# Patient Record
Sex: Male | Born: 1975 | Race: White | Hispanic: No | Marital: Single | Smoking: Former smoker
Health system: Southern US, Community
[De-identification: ages and names within clinical notes are randomized; demographics above are authoritative.]

## PROBLEM LIST (undated history)

## (undated) DIAGNOSIS — M1990 Unspecified osteoarthritis, unspecified site: Secondary | ICD-10-CM

## (undated) DIAGNOSIS — E781 Pure hyperglyceridemia: Secondary | ICD-10-CM

## (undated) DIAGNOSIS — M109 Gout, unspecified: Secondary | ICD-10-CM

## (undated) DIAGNOSIS — I10 Essential (primary) hypertension: Secondary | ICD-10-CM

## (undated) HISTORY — DX: Pure hyperglyceridemia: E78.1

## (undated) HISTORY — DX: Essential (primary) hypertension: I10

## (undated) HISTORY — DX: Unspecified osteoarthritis, unspecified site: M19.90

## (undated) HISTORY — DX: Gout, unspecified: M10.9

---

## 2005-12-12 ENCOUNTER — Emergency Department: Payer: Self-pay | Admitting: Emergency Medicine

## 2005-12-14 ENCOUNTER — Emergency Department: Payer: Self-pay | Admitting: Emergency Medicine

## 2005-12-19 ENCOUNTER — Emergency Department: Payer: Self-pay | Admitting: Emergency Medicine

## 2007-08-31 ENCOUNTER — Ambulatory Visit: Payer: Self-pay | Admitting: Internal Medicine

## 2008-04-09 ENCOUNTER — Ambulatory Visit: Payer: Self-pay | Admitting: Internal Medicine

## 2008-04-18 ENCOUNTER — Ambulatory Visit: Payer: Self-pay | Admitting: Internal Medicine

## 2008-10-10 ENCOUNTER — Emergency Department: Payer: Self-pay | Admitting: Emergency Medicine

## 2011-03-20 ENCOUNTER — Ambulatory Visit: Payer: Self-pay | Admitting: Internal Medicine

## 2012-02-06 ENCOUNTER — Emergency Department: Payer: Self-pay | Admitting: Emergency Medicine

## 2012-08-18 ENCOUNTER — Ambulatory Visit: Payer: Self-pay | Admitting: Physician Assistant

## 2013-03-09 ENCOUNTER — Ambulatory Visit: Payer: Self-pay | Admitting: Unknown Physician Specialty

## 2017-03-15 DIAGNOSIS — I10 Essential (primary) hypertension: Secondary | ICD-10-CM | POA: Diagnosis not present

## 2017-03-15 DIAGNOSIS — R0789 Other chest pain: Secondary | ICD-10-CM | POA: Diagnosis not present

## 2017-03-15 DIAGNOSIS — Z6828 Body mass index (BMI) 28.0-28.9, adult: Secondary | ICD-10-CM | POA: Diagnosis not present

## 2017-03-15 DIAGNOSIS — K219 Gastro-esophageal reflux disease without esophagitis: Secondary | ICD-10-CM | POA: Diagnosis not present

## 2017-03-15 DIAGNOSIS — R079 Chest pain, unspecified: Secondary | ICD-10-CM | POA: Diagnosis not present

## 2017-04-21 ENCOUNTER — Other Ambulatory Visit: Payer: Self-pay | Admitting: Internal Medicine

## 2017-06-03 ENCOUNTER — Other Ambulatory Visit: Payer: Self-pay | Admitting: Nurse Practitioner

## 2017-06-03 MED ORDER — AMLODIPINE BESYLATE 10 MG PO TABS: ORAL_TABLET | ORAL | 3 refills | Status: DC

## 2017-08-07 ENCOUNTER — Other Ambulatory Visit: Payer: Self-pay | Admitting: Internal Medicine

## 2017-08-12 ENCOUNTER — Telehealth: Payer: Self-pay

## 2017-08-12 ENCOUNTER — Other Ambulatory Visit: Payer: Self-pay

## 2017-08-12 MED ORDER — ATENOLOL 50 MG PO TABS: 50.0000 mg | ORAL_TABLET | Freq: Every day | ORAL | 0 refills | Status: DC

## 2017-08-12 MED ORDER — ALLOPURINOL 100 MG PO TABS: 100.0000 mg | ORAL_TABLET | Freq: Every day | ORAL | 0 refills | Status: DC

## 2017-08-12 NOTE — Telephone Encounter (Signed)
Called pt in a refill for allopurinol as well as atenolol per Leretha Pol because patient was out of both medications.

## 2017-08-12 NOTE — Telephone Encounter (Signed)
Can you please call in refill for me? Thanks

## 2017-08-20 ENCOUNTER — Ambulatory Visit: Payer: 59 | Admitting: Nurse Practitioner

## 2017-08-20 ENCOUNTER — Encounter: Payer: Self-pay | Admitting: Nurse Practitioner

## 2017-08-20 VITALS — BP 135/80 | HR 71 | Resp 16 | Ht 74.0 in | Wt 243.8 lb

## 2017-08-20 DIAGNOSIS — M1A9XX0 Chronic gout, unspecified, without tophus (tophi): Secondary | ICD-10-CM | POA: Diagnosis not present

## 2017-08-20 DIAGNOSIS — I10 Essential (primary) hypertension: Secondary | ICD-10-CM | POA: Diagnosis not present

## 2017-08-20 MED ORDER — AMLODIPINE BESYLATE 10 MG PO TABS: ORAL_TABLET | ORAL | 4 refills | Status: DC

## 2017-08-20 MED ORDER — ALLOPURINOL 100 MG PO TABS: 100.0000 mg | ORAL_TABLET | Freq: Every day | ORAL | 4 refills | Status: DC

## 2017-08-20 MED ORDER — ATENOLOL 50 MG PO TABS: 50.0000 mg | ORAL_TABLET | Freq: Every day | ORAL | 4 refills | Status: DC

## 2017-08-20 NOTE — Progress Notes (Signed)
[facility] [place], [place] [address]  Internal MEDICINE  Office Visit Note  Patient Name: Anthony Rios  [id number]  [id number]  Date of Service: 08/20/2017   Pt is here for routine follow up.   Chief Complaint  Patient presents with  . Hypertension    Hypertension  This is a chronic problem. The current episode started more than 1 year ago. The problem is unchanged. The problem is controlled. Pertinent negatives include no chest pain, headaches, neck pain, palpitations or shortness of breath. Risk factors for coronary artery disease include obesity and male gender. Past treatments include beta blockers and calcium channel blockers. The current treatment provides moderate improvement. There are no compliance problems.        Current Medication: Outpatient Encounter Medications as of 08/20/2017  Medication Sig  . allopurinol (ZYLOPRIM) 100 MG tablet Take 1 tablet (100 mg total) by mouth daily.  Marland Kitchen amLODipine (NORVASC) 10 MG tablet TAKE 1 TABLET BY MOUTH EVERYDAY AT BEDTIME  . atenolol (TENORMIN) 50 MG tablet Take 1 tablet (50 mg total) by mouth daily.  . [DISCONTINUED] allopurinol (ZYLOPRIM) 100 MG tablet Take 1 tablet (100 mg total) by mouth daily.  . [DISCONTINUED] amLODipine (NORVASC) 10 MG tablet TAKE 1 TABLET BY MOUTH EVERYDAY AT BEDTIME  . [DISCONTINUED] atenolol (TENORMIN) 50 MG tablet Take 1 tablet (50 mg total) by mouth daily.   No facility-administered encounter medications on file as of 08/20/2017.     Surgical History: History reviewed. No pertinent surgical history.  Medical History: Past Medical History:  Diagnosis Date  . Arthritis   . Gout   . Hypertension     Family History: Family History  Problem Relation Age of Onset  . Hyperlipidemia Mother     Social History   Socioeconomic History  . Marital status: Single    Spouse name: Not on file  . Number of children: Not on file  . Years of education: Not on file  .  Highest education level: Not on file  Occupational History  . Not on file  Social Needs  . Financial resource strain: Not on file  . Food insecurity:    Worry: Not on file    Inability: Not on file  . Transportation needs:    Medical: Not on file    Non-medical: Not on file  Tobacco Use  . Smoking status: Never Smoker  . Smokeless tobacco: Never Used  Substance and Sexual Activity  . Alcohol use: Yes  . Drug use: Never  . Sexual activity: Not on file  Lifestyle  . Physical activity:    Days per week: Not on file    Minutes per session: Not on file  . Stress: Not on file  Relationships  . Social connections:    Talks on phone: Not on file    Gets together: Not on file    Attends religious service: Not on file    Active member of club or organization: Not on file    Attends meetings of clubs or organizations: Not on file    Relationship status: Not on file  . Intimate partner violence:    Fear of current or ex partner: Not on file    Emotionally abused: Not on file    Physically abused: Not on file    Forced sexual activity: Not on file  Other Topics Concern  . Not on file  Social History Narrative  . Not on file      Review of  Systems  Constitutional: Negative for chills, fatigue and unexpected weight change.  HENT: Negative for congestion, postnasal drip, rhinorrhea, sneezing and sore throat.   Eyes: Negative.  Negative for redness.  Respiratory: Negative for cough, chest tightness and shortness of breath.   Cardiovascular: Negative for chest pain and palpitations.  Gastrointestinal: Negative for abdominal distention, abdominal pain, constipation, diarrhea, nausea and vomiting.  Endocrine: Negative for cold intolerance, heat intolerance, polydipsia, polyphagia and polyuria.  Genitourinary: Negative for dysuria and frequency.  Musculoskeletal: Negative for arthralgias, back pain, joint swelling and neck pain.  Skin: Negative for rash.  Allergic/Immunologic:  Negative for environmental allergies.  Neurological: Negative for dizziness, tremors, numbness and headaches.  Hematological: Negative for adenopathy. Does not bruise/bleed easily.  Psychiatric/Behavioral: Negative for behavioral problems (Depression), sleep disturbance and suicidal ideas. The patient is not nervous/anxious.     Today's Vitals   08/20/17 0901  BP: 135/80  Pulse: 71  Resp: 16  SpO2: 99%  Weight: 243 lb 12.8 oz (110.6 kg)  Height: 6' 2"  (1.88 m)    Physical Exam  Constitutional: He is oriented to person, place, and time. He appears well-developed and well-nourished. No distress.  HENT:  Head: Normocephalic and atraumatic.  Nose: Nose normal.  Mouth/Throat: Oropharynx is clear and moist. No oropharyngeal exudate.  Eyes: Pupils are equal, round, and reactive to light. Conjunctivae and EOM are normal.  Neck: Normal range of motion. Neck supple. No JVD present. No tracheal deviation present. No thyromegaly present.  Cardiovascular: Normal rate, regular rhythm and normal heart sounds. Exam reveals no gallop and no friction rub.  No murmur heard. Pulmonary/Chest: Effort normal and breath sounds normal. No respiratory distress. He has no wheezes. He has no rales. He exhibits no tenderness.  Abdominal: Soft. Bowel sounds are normal. There is no tenderness.  Musculoskeletal: Normal range of motion.  Lymphadenopathy:    He has no cervical adenopathy.  Neurological: He is alert and oriented to person, place, and time. No cranial nerve deficit.  Skin: Skin is warm and dry. Capillary refill takes less than 2 seconds. He is not diaphoretic.  Psychiatric: He has a normal mood and affect. His behavior is normal. Judgment and thought content normal.  Nursing note and vitals reviewed.  Assessment/Plan: 1. Essential hypertension Stable. Continue amlodipine and atenolol as prescribed.  - amLODipine (NORVASC) 10 MG tablet; TAKE 1 TABLET BY MOUTH EVERYDAY AT BEDTIME  Dispense: 90  tablet; Refill: 4 - atenolol (TENORMIN) 50 MG tablet; Take 1 tablet (50 mg total) by mouth daily.  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 - allopurinol (ZYLOPRIM) 100 MG tablet; Take 1 tablet (100 mg total) by mouth daily.  Dispense: 90 tablet; Refill: 4  General Counseling: Garris verbalizes understanding of the findings of todays visit and agrees with plan of treatment. I have discussed any further diagnostic evaluation that may be needed or ordered today. We also reviewed his medications today. he has been encouraged to call the office with any questions or concerns that should arise related to todays visit.    Counseling:  Hypertension Counseling:   The following hypertensive lifestyle modification were recommended and discussed:  1. Limiting alcohol intake to less than 1 oz/day of ethanol:(24 oz of beer or 8 oz of wine or 2 oz of 100-proof whiskey). 2. Take baby ASA 81 mg daily. 3. Importance of regular aerobic exercise and losing weight. 4. Reduce dietary saturated fat  and cholesterol intake for overall cardiovascular health. 5. Maintaining adequate dietary potassium, calcium, and magnesium intake. 6. Regular monitoring of the blood pressure. 7. Reduce sodium intake to less than 100 mmol/day (less than 2.3 gm of sodium or less than 6 gm of sodium choride)   This patient was seen by Leretha Pol, FNP- C in Collaboration with Dr Lavera Guise as a part of collaborative care agreement  Meds ordered this encounter  Medications  . allopurinol (ZYLOPRIM) 100 MG tablet    Sig: Take 1 tablet (100 mg total) by mouth daily.    Dispense:  90 tablet    Refill:  4    Please fill as 90 day prescription. Thanks    Order Specific Question:   Supervising Provider    Answer:   Lavera Guise [[id number]]  . amLODipine (NORVASC) 10 MG tablet    Sig: TAKE 1 TABLET BY MOUTH EVERYDAY AT  BEDTIME    Dispense:  90 tablet    Refill:  4    Please fill s 90 day prescription.    Order Specific Question:   Supervising Provider    Answer:   Lavera Guise [[id number]]  . atenolol (TENORMIN) 50 MG tablet    Sig: Take 1 tablet (50 mg total) by mouth daily.    Dispense:  90 tablet    Refill:  4    Order Specific Question:   Supervising Provider    Answer:   Lavera Guise [[id number]]    Time spent: 44 Minutes      Dr Lavera Guise Internal medicine

## 2018-01-27 ENCOUNTER — Ambulatory Visit: Payer: Self-pay | Admitting: Adult Health

## 2018-02-01 ENCOUNTER — Encounter: Payer: Self-pay | Admitting: Adult Health

## 2018-02-01 ENCOUNTER — Ambulatory Visit
Admission: RE | Admit: 2018-02-01 | Discharge: 2018-02-01 | Disposition: A | Discharge status: Home / Self Care | Payer: 59 | Source: Ambulatory Visit | Attending: Adult Health | Admitting: Adult Health

## 2018-02-01 ENCOUNTER — Ambulatory Visit: Payer: 59 | Admitting: Adult Health

## 2018-02-01 VITALS — BP 152/80 | HR 65 | Temp 99.0°F | Resp 16 | Ht 74.0 in | Wt 237.0 lb

## 2018-02-01 DIAGNOSIS — M25511 Pain in right shoulder: Secondary | ICD-10-CM | POA: Diagnosis not present

## 2018-02-01 DIAGNOSIS — M1A9XX0 Chronic gout, unspecified, without tophus (tophi): Secondary | ICD-10-CM

## 2018-02-01 DIAGNOSIS — J029 Acute pharyngitis, unspecified: Secondary | ICD-10-CM | POA: Diagnosis not present

## 2018-02-01 LAB — POCT RAPID STREP A (OFFICE): RAPID STREP A SCREEN: NEGATIVE

## 2018-02-01 MED ORDER — INDOMETHACIN 50 MG PO CAPS: 50.0000 mg | ORAL_CAPSULE | Freq: Three times a day (TID) | ORAL | 3 refills | Status: DC | PRN

## 2018-02-01 NOTE — Progress Notes (Signed)
[facility] [place], [place] [address]  Internal MEDICINE  Office Visit Note  Patient Name: Anthony Rios  [id number]  [id number]  Date of Service: 02/01/2018  Chief Complaint  Patient presents with  . Shoulder Pain    right , been going on for a month  . Sore Throat    HPI Patient here for right shoulder pain that has been present for 1 month.  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  He does not recall a specific incident that started this pain.  Denies any numbness or tingling down his arm and describes the pain as a pulling or sore feeling over his deltoid muscle.  Patient is also reporting that for 1 day now he has had a sore throat that has been very painful and he feels like it is swollen.    Current Medication: Outpatient Encounter Medications as of 02/01/2018  Medication Sig  . allopurinol (ZYLOPRIM) 100 MG tablet Take 1 tablet (100 mg total) by mouth daily.  Marland Kitchen amLODipine (NORVASC) 10 MG tablet TAKE 1 TABLET BY MOUTH EVERYDAY AT BEDTIME  . atenolol (TENORMIN) 50 MG tablet Take 1 tablet (50 mg total) by mouth daily.  . indomethacin (INDOCIN) 50 MG capsule Take 1 capsule (50 mg total) by mouth 3 (three) times daily as needed for moderate pain.   No facility-administered encounter medications on file as of 02/01/2018.     Surgical History: History reviewed. No pertinent surgical history.  Medical History: Past Medical History:  Diagnosis Date  . Arthritis   . Gout   . Hypertension     Family History: Family History  Problem Relation Age of Onset  . Hyperlipidemia Mother     Social History   Socioeconomic History  . Marital status: Single    Spouse name: Not on file  . Number of children: Not on file  . Years of education: Not on file  . Highest education level: Not on file  Occupational History  . Not on file  Social Needs  . Financial resource strain: Not  on file  . Food insecurity:    Worry: Not on file    Inability: Not on file  . Transportation needs:    Medical: Not on file    Non-medical: Not on file  Tobacco Use  . Smoking status: Never Smoker  . Smokeless tobacco: Never Used  Substance and Sexual Activity  . Alcohol use: Yes  . Drug use: Never  . Sexual activity: Not on file  Lifestyle  . Physical activity:    Days per week: Not on file    Minutes per session: Not on file  . Stress: Not on file  Relationships  . Social connections:    Talks on phone: Not on file    Gets together: Not on file    Attends religious service: Not on file    Active member of club or organization: Not on file    Attends meetings of clubs or organizations: Not on file    Relationship status: Not on file  . Intimate partner violence:    Fear of current or ex partner: Not on file    Emotionally abused: Not on file    Physically abused: Not on file    Forced sexual activity: Not on file  Other Topics Concern  . Not on file  Social History Narrative  . Not on file  Review of Systems  Constitutional: Negative.  Negative for chills, fatigue and unexpected weight change.  HENT: Positive for sore throat. Negative for congestion, rhinorrhea and sneezing.   Eyes: Negative for redness.  Respiratory: Negative.  Negative for cough, chest tightness and shortness of breath.   Cardiovascular: Negative.  Negative for chest pain and palpitations.  Gastrointestinal: Negative.  Negative for abdominal pain, constipation, diarrhea, nausea and vomiting.  Endocrine: Negative.   Genitourinary: Negative.  Negative for dysuria and frequency.  Musculoskeletal: Negative.  Negative for arthralgias, back pain, joint swelling and neck pain.       Shoulder pain  Skin: Negative.  Negative for rash.  Allergic/Immunologic: Negative.   Neurological: Negative.  Negative for tremors and numbness.  Hematological: Negative for adenopathy. Does not bruise/bleed easily.   Psychiatric/Behavioral: Negative.  Negative for behavioral problems, sleep disturbance and suicidal ideas. The patient is not nervous/anxious.     Vital Signs: BP (!) 152/80 (BP Location: Left Arm, Patient Position: Sitting, Cuff Size: Normal)   Pulse 65   Temp 99 F (37.2 C) (Oral)   Resp 16   Ht 6' 2"  (1.88 m)   Wt 237 lb (107.5 kg)   SpO2 98%   BMI 30.43 kg/m    Physical Exam  Constitutional: He is oriented to person, place, and time. He appears well-developed and well-nourished. No distress.  HENT:  Head: Normocephalic and atraumatic.  Mouth/Throat: Uvula is midline and oropharynx is clear and moist. No oropharyngeal exudate.  Eyes: Pupils are equal, round, and reactive to light. EOM are normal.  Neck: Normal range of motion. Neck supple. No JVD present. No tracheal deviation present. No thyromegaly present.  Cardiovascular: Normal rate, regular rhythm and normal heart sounds. Exam reveals no gallop and no friction rub.  No murmur heard. Pulmonary/Chest: Effort normal and breath sounds normal. No respiratory distress. He has no wheezes. He has no rales. He exhibits no tenderness.  Abdominal: Soft. There is no tenderness. There is no guarding.  Musculoskeletal: Normal range of motion.  Lymphadenopathy:    He has no cervical adenopathy.  Neurological: He is alert and oriented to person, place, and time. No cranial nerve deficit.  Skin: Skin is warm and dry. He is not diaphoretic.  Psychiatric: He has a normal mood and affect. His behavior is normal. Judgment and thought content normal.  Nursing note and vitals reviewed.  Assessment/Plan:  1. Acute pain of right shoulder X ray of right shoulder performed.  - DG Shoulder Right; Future IMPRESSION: No acute or focal abnormality. Will most likely need MRI for evaluation.   2. Sore throat Strep is negative at today's visit.  We discussed that because his symptoms only been there for just under 24 hours the test may not be  accurate.  He is instructed to call the office if symptoms persist or get worse.  Especially if he gets a fever or notices swelling of his lymph nodes.- POCT rapid strep A  3. Chronic gout without tophus, unspecified cause, unspecified site Refilled patient's indocin at his request for his chronic gout pain. - indomethacin (INDOCIN) 50 MG capsule; Take 1 capsule (50 mg total) by mouth 3 (three) times daily as needed for moderate pain.  Dispense: 90 capsule; Refill: 3  General Counseling: Anthony Rios verbalizes understanding of the findings of todays visit and agrees with plan of treatment. I have discussed any further diagnostic evaluation that may be needed or ordered today. We also reviewed his medications today. he has been encouraged to call  the office with any questions or concerns that should arise related to todays visit.    Orders Placed This Encounter  Procedures  . DG Shoulder Right  . POCT rapid strep A    Meds ordered this encounter  Medications  . indomethacin (INDOCIN) 50 MG capsule    Sig: Take 1 capsule (50 mg total) by mouth 3 (three) times daily as needed for moderate pain.    Dispense:  90 capsule    Refill:  3    Time spent: 25 Minutes   This patient was seen by Orson Gear AGNP-C in Collaboration with Dr Lavera Guise as a part of collaborative care agreement     Kendell Bane AGNP-C Internal medicine

## 2018-02-01 NOTE — Patient Instructions (Signed)
Shoulder Pain Many things can cause shoulder pain, including:  An injury.  Moving the arm in the same way again and again (overuse).  Joint pain (arthritis).  Follow these instructions at home: Take these actions to help with your pain:  Squeeze a soft ball or a foam pad as much as you can. This helps to prevent swelling. It also makes the arm stronger.  Take over-the-counter and prescription medicines only as told by your doctor.  If told, put ice on the area: ? Put ice in a plastic bag. ? Place a towel between your skin and the bag. ? Leave the ice on for 20 minutes, 2-3 times per day. Stop putting on ice if it does not help with the pain.  If you were given a shoulder sling or immobilizer: ? Wear it as told. ? Remove it to shower or bathe. ? Move your arm as little as possible. ? Keep your hand moving. This helps prevent swelling.  Contact a doctor if:  Your pain gets worse.  Medicine does not help your pain.  You have new pain in your arm, hand, or fingers. Get help right away if:  Your arm, hand, or fingers: ? Tingle. ? Are numb. ? Are swollen. ? Are painful. ? Turn white or blue. This information is not intended to replace advice given to you by your health care provider. Make sure you discuss any questions you have with your health care provider. Document Released: 07/29/2007 Document Revised: 10/06/2015 Document Reviewed: 06/04/2014 Elsevier Interactive Patient Education  [employer].

## 2018-02-03 ENCOUNTER — Telehealth: Payer: Self-pay | Admitting: Adult Health

## 2018-02-03 NOTE — Telephone Encounter (Signed)
Patient advised on xray results , adam will order an mri

## 2018-02-21 ENCOUNTER — Ambulatory Visit: Payer: Self-pay | Admitting: Adult Health

## 2018-02-24 ENCOUNTER — Telehealth: Payer: Self-pay

## 2018-02-24 NOTE — Telephone Encounter (Signed)
Unable to authorize patient mri, called and left message he will need to come by office and bring a copy of new insurance information. Beth

## 2018-07-12 ENCOUNTER — Telehealth: Payer: Self-pay

## 2018-07-12 ENCOUNTER — Other Ambulatory Visit: Payer: Self-pay

## 2018-07-12 DIAGNOSIS — M1A9XX0 Chronic gout, unspecified, without tophus (tophi): Secondary | ICD-10-CM

## 2018-07-12 MED ORDER — ALLOPURINOL 100 MG PO TABS: 100.0000 mg | ORAL_TABLET | Freq: Every day | ORAL | 0 refills | Status: DC

## 2018-07-12 NOTE — Telephone Encounter (Signed)
lmom to call us back need appt for further refills

## 2018-07-26 ENCOUNTER — Other Ambulatory Visit: Payer: Self-pay | Admitting: Nurse Practitioner

## 2018-07-26 DIAGNOSIS — M1A9XX0 Chronic gout, unspecified, without tophus (tophi): Secondary | ICD-10-CM

## 2018-07-27 ENCOUNTER — Other Ambulatory Visit: Payer: Self-pay | Admitting: Nurse Practitioner

## 2018-07-27 DIAGNOSIS — M1A9XX0 Chronic gout, unspecified, without tophus (tophi): Secondary | ICD-10-CM

## 2018-09-25 DIAGNOSIS — Z20828 Contact with and (suspected) exposure to other viral communicable diseases: Secondary | ICD-10-CM | POA: Diagnosis not present

## 2018-09-29 ENCOUNTER — Other Ambulatory Visit: Payer: Self-pay

## 2018-09-29 DIAGNOSIS — M1A9XX0 Chronic gout, unspecified, without tophus (tophi): Secondary | ICD-10-CM

## 2018-09-29 DIAGNOSIS — I10 Essential (primary) hypertension: Secondary | ICD-10-CM

## 2018-09-29 MED ORDER — ALLOPURINOL 100 MG PO TABS: 100.0000 mg | ORAL_TABLET | Freq: Every day | ORAL | 0 refills | Status: DC

## 2018-09-29 MED ORDER — ATENOLOL 50 MG PO TABS: 50.0000 mg | ORAL_TABLET | Freq: Every day | ORAL | 0 refills | Status: DC

## 2018-09-29 MED ORDER — AMLODIPINE BESYLATE 10 MG PO TABS: ORAL_TABLET | ORAL | 0 refills | Status: DC

## 2018-09-29 NOTE — Telephone Encounter (Signed)
Spoke with pt need appt for further refills he make appt next friday

## 2018-10-07 ENCOUNTER — Ambulatory Visit: Payer: Self-pay | Admitting: Nurse Practitioner

## 2018-10-24 ENCOUNTER — Other Ambulatory Visit: Payer: Self-pay

## 2018-10-24 DIAGNOSIS — I10 Essential (primary) hypertension: Secondary | ICD-10-CM

## 2018-10-24 MED ORDER — AMLODIPINE BESYLATE 10 MG PO TABS: ORAL_TABLET | ORAL | 0 refills | Status: DC

## 2018-10-24 MED ORDER — ATENOLOL 50 MG PO TABS: 50.0000 mg | ORAL_TABLET | Freq: Every day | ORAL | 0 refills | Status: DC

## 2018-11-01 ENCOUNTER — Ambulatory Visit: Payer: BC Managed Care – PPO | Admitting: Nurse Practitioner

## 2018-11-01 ENCOUNTER — Other Ambulatory Visit: Payer: Self-pay

## 2018-11-01 ENCOUNTER — Encounter: Payer: Self-pay | Admitting: Nurse Practitioner

## 2018-11-01 VITALS — BP 133/83 | HR 65 | Resp 16 | Ht 74.0 in | Wt 242.4 lb

## 2018-11-01 DIAGNOSIS — I10 Essential (primary) hypertension: Secondary | ICD-10-CM | POA: Diagnosis not present

## 2018-11-01 DIAGNOSIS — M1A9XX0 Chronic gout, unspecified, without tophus (tophi): Secondary | ICD-10-CM

## 2018-11-01 MED ORDER — ALLOPURINOL 100 MG PO TABS: 100.0000 mg | ORAL_TABLET | Freq: Every day | ORAL | 5 refills | Status: DC

## 2018-11-01 MED ORDER — AMLODIPINE BESYLATE 10 MG PO TABS: ORAL_TABLET | ORAL | 5 refills | Status: DC

## 2018-11-01 MED ORDER — INDOMETHACIN 50 MG PO CAPS: 50.0000 mg | ORAL_CAPSULE | Freq: Three times a day (TID) | ORAL | 3 refills | Status: DC | PRN

## 2018-11-01 MED ORDER — ATENOLOL 50 MG PO TABS: 50.0000 mg | ORAL_TABLET | Freq: Every day | ORAL | 5 refills | Status: DC

## 2018-11-01 NOTE — Progress Notes (Signed)
[facility] [place], [place] [address]  Internal MEDICINE  Office Visit Note  Patient Name: Anthony Rios  [id number]  [id number]  Date of Service: 11/02/2018  Chief Complaint  Patient presents with  . Hypertension  . Gout  . Arthritis  . Medication Refill    Hypertension This is a chronic problem. The current episode started more than 1 year ago. The problem is unchanged. The problem is controlled. Pertinent negatives include no chest pain, headaches, neck pain, palpitations or shortness of breath. Risk factors for coronary artery disease include obesity and male gender. Past treatments include beta blockers and calcium channel blockers. The current treatment provides moderate improvement. There are no compliance problems.        Current Medication: Outpatient Encounter Medications as of 11/01/2018  Medication Sig  . allopurinol (ZYLOPRIM) 100 MG tablet Take 1 tablet (100 mg total) by mouth daily.  Marland Kitchen amLODipine (NORVASC) 10 MG tablet TAKE 1 TABLET BY MOUTH EVERYDAY AT BEDTIME  . atenolol (TENORMIN) 50 MG tablet Take 1 tablet (50 mg total) by mouth daily.  . indomethacin (INDOCIN) 50 MG capsule Take 1 capsule (50 mg total) by mouth 3 (three) times daily as needed for moderate pain.  . [DISCONTINUED] allopurinol (ZYLOPRIM) 100 MG tablet Take 1 tablet (100 mg total) by mouth daily.  . [DISCONTINUED] amLODipine (NORVASC) 10 MG tablet TAKE 1 TABLET BY MOUTH EVERYDAY AT BEDTIME  . [DISCONTINUED] atenolol (TENORMIN) 50 MG tablet Take 1 tablet (50 mg total) by mouth daily.  . [DISCONTINUED] indomethacin (INDOCIN) 50 MG capsule Take 1 capsule (50 mg total) by mouth 3 (three) times daily as needed for moderate pain.   No facility-administered encounter medications on file as of 11/01/2018.     Surgical History: History reviewed. No pertinent surgical history.  Medical History: Past Medical History:  Diagnosis Date  . Arthritis   . Gout   . Hypertension      Family History: Family History  Problem Relation Age of Onset  . Hyperlipidemia Mother     Social History   Socioeconomic History  . Marital status: Single    Spouse name: Not on file  . Number of children: Not on file  . Years of education: Not on file  . Highest education level: Not on file  Occupational History  . Not on file  Social Needs  . Financial resource strain: Not on file  . Food insecurity    Worry: Not on file    Inability: Not on file  . Transportation needs    Medical: Not on file    Non-medical: Not on file  Tobacco Use  . Smoking status: Former Research scientist (life sciences)  . Smokeless tobacco: Former Network engineer and Sexual Activity  . Alcohol use: Yes  . Drug use: Never  . Sexual activity: Not on file  Lifestyle  . Physical activity    Days per week: Not on file    Minutes per session: Not on file  . Stress: Not on file  Relationships  . Social Herbalist on phone: Not on file    Gets together: Not on file    Attends religious service: Not on file    Active member of club or organization: Not on file    Attends meetings of clubs or organizations: Not on file    Relationship status: Not on file  . Intimate partner violence    Fear of current or ex partner: Not on file  Emotionally abused: Not on file    Physically abused: Not on file    Forced sexual activity: Not on file  Other Topics Concern  . Not on file  Social History Narrative  . Not on file      Review of Systems  Constitutional: Negative for chills, fatigue and unexpected weight change.  HENT: Negative for congestion, postnasal drip, rhinorrhea, sneezing and sore throat.   Respiratory: Negative for cough, chest tightness and shortness of breath.   Cardiovascular: Negative for chest pain and palpitations.  Gastrointestinal: Negative for abdominal distention, abdominal pain, constipation, diarrhea, nausea and vomiting.  Endocrine: Negative for cold intolerance, heat intolerance,  polydipsia and polyuria.  Musculoskeletal: Negative for arthralgias, back pain, joint swelling and neck pain.  Skin: Negative for rash.  Allergic/Immunologic: Negative for environmental allergies.  Neurological: Negative for dizziness, tremors, numbness and headaches.  Hematological: Negative for adenopathy. Does not bruise/bleed easily.  Psychiatric/Behavioral: Negative for behavioral problems (Depression), sleep disturbance and suicidal ideas. The patient is not nervous/anxious.     Today's Vitals   11/01/18 0847  BP: 133/83  Pulse: 65  Resp: 16  SpO2: 98%  Weight: 242 lb 6.4 oz (110 kg)  Height: 6' 2"  (1.88 m)   Body mass index is 31.12 kg/m.  Physical Exam Vitals signs and nursing note reviewed.  Constitutional:      General: He is not in acute distress.    Appearance: Normal appearance. He is well-developed. He is not diaphoretic.  HENT:     Head: Normocephalic and atraumatic.     Nose: Nose normal.     Mouth/Throat:     Pharynx: No oropharyngeal exudate.  Eyes:     Conjunctiva/sclera: Conjunctivae normal.     Pupils: Pupils are equal, round, and reactive to light.  Neck:     Musculoskeletal: Normal range of motion and neck supple.     Thyroid: No thyromegaly.     Vascular: No JVD.     Trachea: No tracheal deviation.  Cardiovascular:     Rate and Rhythm: Normal rate and regular rhythm.     Heart sounds: Normal heart sounds. No murmur. No friction rub. No gallop.   Pulmonary:     Effort: Pulmonary effort is normal. No respiratory distress.     Breath sounds: Normal breath sounds. No wheezing or rales.  Chest:     Chest wall: No tenderness.  Abdominal:     Palpations: Abdomen is soft.     Tenderness: There is no abdominal tenderness.  Musculoskeletal: Normal range of motion.  Lymphadenopathy:     Cervical: No cervical adenopathy.  Skin:    General: Skin is warm and dry.     Capillary Refill: Capillary refill takes less than 2 seconds.  Neurological:      Mental Status: He is alert and oriented to person, place, and time.     Cranial Nerves: No cranial nerve deficit.  Psychiatric:        Behavior: Behavior normal.        Thought Content: Thought content normal.        Judgment: Judgment normal.    Assessment/Plan:  1. Essential hypertension Blood pressure well managed. Continue BP medication as prescribed. Refills provided today.  - amLODipine (NORVASC) 10 MG tablet; TAKE 1 TABLET BY MOUTH EVERYDAY AT BEDTIME  Dispense: 30 tablet; Refill: 5 - atenolol (TENORMIN) 50 MG tablet; Take 1 tablet (50 mg total) by mouth daily.  Dispense: 45 tablet; Refill: 5  2. Chronic gout without  tophus, unspecified cause, unspecified site Continue to take allopurinol 141m daily to prevent gout flares. May take indomethacin as needed and as prescribed, - allopurinol (ZYLOPRIM) 100 MG tablet; Take 1 tablet (100 mg total) by mouth daily.  Dispense: 30 tablet; Refill: 5 - indomethacin (INDOCIN) 50 MG capsule; Take 1 capsule (50 mg total) by mouth 3 (three) times daily as needed for moderate pain.  Dispense: 90 capsule; Refill: 3  General Counseling: Amelia verbalizes understanding of the findings of todays visit and agrees with plan of treatment. I have discussed any further diagnostic evaluation that may be needed or ordered today. We also reviewed his medications today. he has been encouraged to call the office with any questions or concerns that should arise related to todays visit.  Hypertension Counseling:   The following hypertensive lifestyle modification were recommended and discussed:  1. Limiting alcohol intake to less than 1 oz/day of ethanol:(24 oz of beer or 8 oz of wine or 2 oz of 100-proof whiskey). 2. Take baby ASA 81 mg daily. 3. Importance of regular aerobic exercise and losing weight. 4. Reduce dietary saturated fat and cholesterol intake for overall cardiovascular health. 5. Maintaining adequate dietary potassium, calcium, and magnesium  intake. 6. Regular monitoring of the blood pressure. 7. Reduce sodium intake to less than 100 mmol/day (less than 2.3 gm of sodium or less than 6 gm of sodium choride)   This patient was [place] [facility] [place] F[place]vera Guiseas a part of collaborative care agreement  Meds ordered this encounter  Medications  . allopurinol (ZYLOPRIM) 100 MG tablet    Sig: Take 1 tablet (100 mg total) by mouth daily.    Dispense:  30 tablet    Refill:  5    Order Specific Question:   Supervising Provider    Answer:   KLavera Guise[[[id number]] . amLODipine (NORVASC) 10 MG tablet    Sig: TAKE 1 TABLET BY MOUTH EVERYDAY AT BEDTIME    Dispense:  30 tablet    Refill:  5    Order Specific Question:   Supervising Provider    Answer:   KLavera Guise[[[id number]] . atenolol (TENORMIN) 50 MG tablet    Sig: Take 1 tablet (50 mg total) by mouth daily.    Dispense:  45 tablet    Refill:  5    Order Specific Question:   Supervising Provider    Answer:   KLavera Guise[[[id number]] . indomethacin (INDOCIN) 50 MG capsule    Sig: Take 1 capsule (50 mg total) by mouth 3 (three) times daily as needed for moderate pain.    Dispense:  90 capsule    Refill:  3    Order Specific Question:   Supervising Provider    Answer:   KLavera Guise[[[id number]]   Time spent: 153Minutes      Dr FLavera GuiseInternal medicine

## 2019-02-09 DIAGNOSIS — Z20828 Contact with and (suspected) exposure to other viral communicable diseases: Secondary | ICD-10-CM | POA: Diagnosis not present

## 2019-04-25 ENCOUNTER — Telehealth: Payer: Self-pay

## 2019-04-25 NOTE — Telephone Encounter (Signed)
LMOM FOR PATIENT TO CONFIRM AND SCREEN FOR 04-27-19 OV.

## 2019-04-27 ENCOUNTER — Encounter: Payer: BC Managed Care – PPO | Admitting: Nurse Practitioner

## 2019-05-04 ENCOUNTER — Other Ambulatory Visit: Payer: Self-pay

## 2019-05-04 ENCOUNTER — Telehealth: Payer: Self-pay

## 2019-05-04 DIAGNOSIS — M1A9XX0 Chronic gout, unspecified, without tophus (tophi): Secondary | ICD-10-CM

## 2019-05-04 MED ORDER — ALLOPURINOL 100 MG PO TABS: 100.0000 mg | ORAL_TABLET | Freq: Every day | ORAL | 0 refills | Status: DC

## 2019-05-04 NOTE — Telephone Encounter (Signed)
Left a message and advised pt he needs appointment for further refill on medications. Anthony Rios

## 2019-05-30 ENCOUNTER — Other Ambulatory Visit: Payer: Self-pay | Admitting: Nurse Practitioner

## 2019-05-30 DIAGNOSIS — M1A9XX0 Chronic gout, unspecified, without tophus (tophi): Secondary | ICD-10-CM

## 2019-06-06 ENCOUNTER — Other Ambulatory Visit: Payer: Self-pay

## 2019-06-06 DIAGNOSIS — I10 Essential (primary) hypertension: Secondary | ICD-10-CM

## 2019-06-06 MED ORDER — AMLODIPINE BESYLATE 10 MG PO TABS: ORAL_TABLET | ORAL | 0 refills | Status: DC

## 2019-07-05 ENCOUNTER — Other Ambulatory Visit: Payer: Self-pay

## 2019-07-05 DIAGNOSIS — I10 Essential (primary) hypertension: Secondary | ICD-10-CM

## 2019-07-05 DIAGNOSIS — M1A9XX0 Chronic gout, unspecified, without tophus (tophi): Secondary | ICD-10-CM

## 2019-07-05 MED ORDER — ALLOPURINOL 100 MG PO TABS: 100.0000 mg | ORAL_TABLET | Freq: Every day | ORAL | 0 refills | Status: DC

## 2019-07-05 MED ORDER — AMLODIPINE BESYLATE 10 MG PO TABS: ORAL_TABLET | ORAL | 0 refills | Status: DC

## 2019-07-12 ENCOUNTER — Telehealth: Payer: Self-pay

## 2019-07-12 ENCOUNTER — Other Ambulatory Visit: Payer: Self-pay

## 2019-07-12 DIAGNOSIS — M1A9XX0 Chronic gout, unspecified, without tophus (tophi): Secondary | ICD-10-CM

## 2019-07-12 DIAGNOSIS — I10 Essential (primary) hypertension: Secondary | ICD-10-CM

## 2019-07-12 MED ORDER — ATENOLOL 50 MG PO TABS: 50.0000 mg | ORAL_TABLET | Freq: Every day | ORAL | 0 refills | Status: DC

## 2019-07-12 MED ORDER — AMLODIPINE BESYLATE 10 MG PO TABS: ORAL_TABLET | ORAL | 0 refills | Status: DC

## 2019-07-12 MED ORDER — ALLOPURINOL 100 MG PO TABS: 100.0000 mg | ORAL_TABLET | Freq: Every day | ORAL | 0 refills | Status: DC

## 2019-07-12 NOTE — Telephone Encounter (Signed)
Pt made her appt on June 15 send 30 days med

## 2019-07-12 NOTE — Telephone Encounter (Signed)
Called lmom informing patient need to reschedule missed 6 month fu, ann physical. klh

## 2019-08-03 ENCOUNTER — Other Ambulatory Visit: Payer: Self-pay

## 2019-08-03 DIAGNOSIS — M1A9XX0 Chronic gout, unspecified, without tophus (tophi): Secondary | ICD-10-CM

## 2019-08-03 DIAGNOSIS — I10 Essential (primary) hypertension: Secondary | ICD-10-CM

## 2019-08-03 MED ORDER — ALLOPURINOL 100 MG PO TABS: 100.0000 mg | ORAL_TABLET | Freq: Every day | ORAL | 0 refills | Status: DC

## 2019-08-03 MED ORDER — AMLODIPINE BESYLATE 10 MG PO TABS: ORAL_TABLET | ORAL | 0 refills | Status: DC

## 2019-08-04 ENCOUNTER — Telehealth: Payer: Self-pay

## 2019-08-04 NOTE — Telephone Encounter (Signed)
Lmom to confirm and screen for 08-08-19 ov.

## 2019-08-07 ENCOUNTER — Other Ambulatory Visit: Payer: Self-pay

## 2019-08-07 DIAGNOSIS — I10 Essential (primary) hypertension: Secondary | ICD-10-CM

## 2019-08-07 MED ORDER — AMLODIPINE BESYLATE 10 MG PO TABS: ORAL_TABLET | ORAL | 0 refills | Status: DC

## 2019-08-07 MED ORDER — ATENOLOL 50 MG PO TABS: 50.0000 mg | ORAL_TABLET | Freq: Every day | ORAL | 0 refills | Status: DC

## 2019-08-08 ENCOUNTER — Encounter: Payer: BC Managed Care – PPO | Admitting: Nurse Practitioner

## 2019-08-21 ENCOUNTER — Other Ambulatory Visit: Payer: Self-pay | Admitting: Internal Medicine

## 2019-08-21 DIAGNOSIS — I10 Essential (primary) hypertension: Secondary | ICD-10-CM | POA: Diagnosis not present

## 2019-08-21 DIAGNOSIS — Z0001 Encounter for general adult medical examination with abnormal findings: Secondary | ICD-10-CM | POA: Diagnosis not present

## 2019-08-21 DIAGNOSIS — M1A9XX0 Chronic gout, unspecified, without tophus (tophi): Secondary | ICD-10-CM | POA: Diagnosis not present

## 2019-08-21 DIAGNOSIS — Z125 Encounter for screening for malignant neoplasm of prostate: Secondary | ICD-10-CM | POA: Diagnosis not present

## 2019-08-21 DIAGNOSIS — R946 Abnormal results of thyroid function studies: Secondary | ICD-10-CM | POA: Diagnosis not present

## 2019-08-22 LAB — CBC
Hematocrit: 43.9 % (ref 37.5–51.0)
Hemoglobin: 14.4 g/dL (ref 13.0–17.7)
MCH: 28.5 pg (ref 26.6–33.0)
MCHC: 32.8 g/dL (ref 31.5–35.7)
MCV: 87 fL (ref 79–97)
Platelets: 180 10*3/uL (ref 150–450)
RBC: 5.06 x10E6/uL (ref 4.14–5.80)
RDW: 12.1 % (ref 11.6–15.4)
WBC: 6.3 10*3/uL (ref 3.4–10.8)

## 2019-08-22 LAB — TSH: TSH: 2.32 u[IU]/mL (ref 0.450–4.500)

## 2019-08-22 LAB — LIPID PANEL W/O CHOL/HDL RATIO
Cholesterol, Total: 207 mg/dL — ABNORMAL HIGH (ref 100–199)
HDL: 31 mg/dL — ABNORMAL LOW (ref >39)
LDL Chol Calc (NIH): 85 mg/dL (ref 0–99)
Triglycerides: 559 mg/dL (ref 0–149)
VLDL Cholesterol Cal: 91 mg/dL — ABNORMAL HIGH (ref 5–40)

## 2019-08-22 LAB — COMPREHENSIVE METABOLIC PANEL
ALT: 39 IU/L (ref 0–44)
AST: 31 IU/L (ref 0–40)
Albumin/Globulin Ratio: 1.7 (ref 1.2–2.2)
Albumin: 4.5 g/dL (ref 4.0–5.0)
Alkaline Phosphatase: 89 IU/L (ref 48–121)
BUN/Creatinine Ratio: 22 — ABNORMAL HIGH (ref 9–20)
BUN: 25 mg/dL — ABNORMAL HIGH (ref 6–24)
Bilirubin Total: 0.4 mg/dL (ref 0.0–1.2)
CO2: 21 mmol/L (ref 20–29)
Calcium: 9.1 mg/dL (ref 8.7–10.2)
Chloride: 101 mmol/L (ref 96–106)
Creatinine, Ser: 1.14 mg/dL (ref 0.76–1.27)
GFR calc Af Amer: 89 mL/min/{1.73_m2} (ref >59)
GFR calc non Af Amer: 77 mL/min/{1.73_m2} (ref >59)
Globulin, Total: 2.6 g/dL (ref 1.5–4.5)
Glucose: 96 mg/dL (ref 65–99)
Potassium: 4.8 mmol/L (ref 3.5–5.2)
Sodium: 136 mmol/L (ref 134–144)
Total Protein: 7.1 g/dL (ref 6.0–8.5)

## 2019-08-22 LAB — PSA: Prostate Specific Ag, Serum: 0.4 ng/mL (ref 0.0–4.0)

## 2019-08-22 LAB — URIC ACID: Uric Acid: 9.2 mg/dL — ABNORMAL HIGH (ref 3.8–8.4)

## 2019-08-22 LAB — T4, FREE: Free T4: 0.96 ng/dL (ref 0.82–1.77)

## 2019-08-25 ENCOUNTER — Telehealth: Payer: Self-pay

## 2019-08-25 NOTE — Telephone Encounter (Signed)
Lmom to confirm and screen for 08-29-19 ov.

## 2019-08-27 ENCOUNTER — Other Ambulatory Visit: Payer: Self-pay | Admitting: Nurse Practitioner

## 2019-08-27 DIAGNOSIS — M1A9XX0 Chronic gout, unspecified, without tophus (tophi): Secondary | ICD-10-CM

## 2019-08-27 NOTE — Progress Notes (Signed)
Is it fasting lab draw, TG are extremely elevated

## 2019-08-29 ENCOUNTER — Other Ambulatory Visit: Payer: Self-pay | Admitting: Nurse Practitioner

## 2019-08-29 ENCOUNTER — Other Ambulatory Visit: Payer: Self-pay

## 2019-08-29 ENCOUNTER — Ambulatory Visit (INDEPENDENT_AMBULATORY_CARE_PROVIDER_SITE_OTHER): Payer: BC Managed Care – PPO | Admitting: Nurse Practitioner

## 2019-08-29 ENCOUNTER — Encounter: Payer: Self-pay | Admitting: Nurse Practitioner

## 2019-08-29 VITALS — BP 144/86 | HR 61 | Temp 97.6°F | Resp 16 | Ht 74.0 in | Wt 235.2 lb

## 2019-08-29 DIAGNOSIS — I10 Essential (primary) hypertension: Secondary | ICD-10-CM

## 2019-08-29 DIAGNOSIS — Z0001 Encounter for general adult medical examination with abnormal findings: Secondary | ICD-10-CM | POA: Diagnosis not present

## 2019-08-29 DIAGNOSIS — E781 Pure hyperglyceridemia: Secondary | ICD-10-CM | POA: Diagnosis not present

## 2019-08-29 DIAGNOSIS — F1090 Alcohol use, unspecified, uncomplicated: Secondary | ICD-10-CM

## 2019-08-29 DIAGNOSIS — R300 Dysuria: Secondary | ICD-10-CM | POA: Diagnosis not present

## 2019-08-29 DIAGNOSIS — M1A9XX0 Chronic gout, unspecified, without tophus (tophi): Secondary | ICD-10-CM | POA: Diagnosis not present

## 2019-08-29 DIAGNOSIS — Z7289 Other problems related to lifestyle: Secondary | ICD-10-CM

## 2019-08-29 MED ORDER — AMLODIPINE BESYLATE 10 MG PO TABS: ORAL_TABLET | ORAL | 1 refills | Status: DC

## 2019-08-29 MED ORDER — FENOFIBRATE 145 MG PO TABS: 145.0000 mg | ORAL_TABLET | Freq: Every day | ORAL | 1 refills | Status: DC

## 2019-08-29 MED ORDER — ATENOLOL 50 MG PO TABS: 50.0000 mg | ORAL_TABLET | Freq: Every day | ORAL | 1 refills | Status: DC

## 2019-08-29 MED ORDER — ALLOPURINOL 100 MG PO TABS: 100.0000 mg | ORAL_TABLET | Freq: Every day | ORAL | 1 refills | Status: DC

## 2019-08-29 NOTE — Progress Notes (Signed)
[facility], [place] [address]  Internal MEDICINE  Office Visit Note  Patient Name: Anthony Rios  [id number]  [id number]  Date of Service: 09/03/2019   Pt is here for routine health maintenance examination  Chief Complaint  Patient presents with   Annual Exam   Arthritis   Hypertension   Gout     The patient is here for health maintenance exam. He had routine, fasting labs done prior to this visit. His triglyceride levels were very high at 553. His uric acid is also a bit elevated at 9.2. he generally takes a preventive dose of allopurinol to keep uric acid low, but he has been out of this for a few weeks. He reports regular alcohol consumption. States that his intake is usually about 6 beers per night, every night. He has no other concerns or complaints today.     Current Medication: Outpatient Encounter Medications as of 08/29/2019  Medication Sig   allopurinol (ZYLOPRIM) 100 MG tablet Take 1 tablet (100 mg total) by mouth daily.   amLODipine (NORVASC) 10 MG tablet TAKE 1 TABLET BY MOUTH EVERYDAY AT BEDTIME   atenolol (TENORMIN) 50 MG tablet Take 1 tablet (50 mg total) by mouth daily.   indomethacin (INDOCIN) 50 MG capsule Take 1 capsule (50 mg total) by mouth 3 (three) times daily as needed for moderate pain.   [DISCONTINUED] allopurinol (ZYLOPRIM) 100 MG tablet Take 1 tablet (100 mg total) by mouth daily.   [DISCONTINUED] amLODipine (NORVASC) 10 MG tablet TAKE 1 TABLET BY MOUTH EVERYDAY AT BEDTIME   [DISCONTINUED] atenolol (TENORMIN) 50 MG tablet Take 1 tablet (50 mg total) by mouth daily.   [DISCONTINUED] fenofibrate (TRICOR) 145 MG tablet Take 1 tablet (145 mg total) by mouth daily.   No facility-administered encounter medications on file as of 08/29/2019.    Surgical History: History reviewed. No pertinent surgical history.  Medical History: Past Medical History:  Diagnosis Date   Arthritis    Gout     Hypertension     Family History: Family History  Problem Relation Age of Onset   Hyperlipidemia Mother       Review of Systems  Constitutional: Negative for chills, fatigue and unexpected weight change.  HENT: Negative for congestion, postnasal drip, rhinorrhea, sneezing and sore throat.   Respiratory: Negative for cough, chest tightness and shortness of breath.   Cardiovascular: Negative for chest pain and palpitations.  Gastrointestinal: Negative for abdominal distention, abdominal pain, constipation, diarrhea, nausea and vomiting.  Endocrine: Negative for cold intolerance, heat intolerance, polydipsia and polyuria.  Genitourinary: Negative for dysuria, frequency, hematuria and urgency.  Musculoskeletal: Positive for arthralgias. Negative for back pain, joint swelling and neck pain.       States that he has not had a gout flare in over six months.   Skin: Negative for rash.  Allergic/Immunologic: Negative for environmental allergies.  Neurological: Negative for dizziness, tremors, numbness and headaches.  Hematological: Negative for adenopathy. Does not bruise/bleed easily.  Psychiatric/Behavioral: Negative for behavioral problems (Depression), sleep disturbance and suicidal ideas. The patient is not nervous/anxious.     Today's Vitals   08/29/19 0832  BP: (!) 144/86  Pulse: 61  Resp: 16  Temp: 97.6 F (36.4 C)  SpO2: 100%  Weight: 235 lb 3.2 oz (106.7 kg)  Height: 6' 2"  (1.88 m)   Body mass index is 30.2 kg/m.  Physical Exam Vitals and nursing note reviewed.  Constitutional:      General: He is not  in acute distress.    Appearance: Normal appearance. He is well-developed. He is not diaphoretic.  HENT:     Head: Normocephalic and atraumatic.     Nose: Nose normal.     Mouth/Throat:     Pharynx: No oropharyngeal exudate.  Eyes:     Conjunctiva/sclera: Conjunctivae normal.     Pupils: Pupils are equal, round, and reactive to light.  Neck:     Thyroid: No  thyromegaly.     Vascular: No JVD.     Trachea: No tracheal deviation.  Cardiovascular:     Rate and Rhythm: Normal rate and regular rhythm.     Pulses: Normal pulses.     Heart sounds: Normal heart sounds. No murmur heard.  No friction rub. No gallop.   Pulmonary:     Effort: Pulmonary effort is normal. No respiratory distress.     Breath sounds: Normal breath sounds. No wheezing or rales.  Chest:     Chest wall: No tenderness.  Abdominal:     General: Bowel sounds are normal.     Palpations: Abdomen is soft.     Tenderness: There is no abdominal tenderness.  Musculoskeletal:        General: Normal range of motion.     Cervical back: Normal range of motion and neck supple.  Lymphadenopathy:     Cervical: No cervical adenopathy.  Skin:    General: Skin is warm and dry.     Capillary Refill: Capillary refill takes less than 2 seconds.  Neurological:     Mental Status: He is alert and oriented to person, place, and time.     Cranial Nerves: No cranial nerve deficit.  Psychiatric:        Mood and Affect: Mood normal.        Behavior: Behavior normal.        Thought Content: Thought content normal.        Judgment: Judgment normal.      LABS: Recent Results (from the past 2160 hour(s))  Comprehensive metabolic panel     Status: Abnormal   Collection Time: 08/21/19  8:50 AM  Result Value Ref Range   Glucose 96 65 - 99 mg/dL   BUN 25 (H) 6 - 24 mg/dL   Creatinine, Ser 1.14 0.76 - 1.27 mg/dL   GFR calc non Af Amer 77 >59 mL/min/1.73   GFR calc Af Amer 89 >59 mL/min/1.73    Comment: **[facility] currently reports eGFR in compliance with the current**   recommendations of the [employer]. [facility] will   update reporting as new guidelines are published from the NKF-ASN   Task force.    BUN/Creatinine Ratio 22 (H) 9 - 20   Sodium 136 134 - 144 mmol/L   Potassium 4.8 3.5 - 5.2 mmol/L   Chloride 101 96 - 106 mmol/L   CO2 21 20 - 29 mmol/L   Calcium 9.1 8.7 -  10.2 mg/dL   Total Protein 7.1 6.0 - 8.5 g/dL   Albumin 4.5 4.0 - 5.0 g/dL   Globulin, Total 2.6 1.5 - 4.5 g/dL   Albumin/Globulin Ratio 1.7 1.2 - 2.2   Bilirubin Total 0.4 0.0 - 1.2 mg/dL   Alkaline Phosphatase 89 48 - 121 IU/L   AST 31 0 - 40 IU/L   ALT 39 0 - 44 IU/L  CBC     Status: None   Collection Time: 08/21/19  8:50 AM  Result Value Ref Range   WBC 6.3 3.4 -  10.8 x10E3/uL   RBC 5.06 4.14 - 5.80 x10E6/uL   Hemoglobin 14.4 13.0 - 17.7 g/dL   Hematocrit 43.9 37.5 - 51.0 %   MCV 87 79 - 97 fL   MCH 28.5 26.6 - 33.0 pg   MCHC 32.8 31 - 35 g/dL   RDW 12.1 11.6 - 15.4 %   Platelets 180 150 - 450 x10E3/uL  Lipid Panel w/o Chol/HDL Ratio     Status: Abnormal   Collection Time: 08/21/19  8:50 AM  Result Value Ref Range   Cholesterol, Total 207 (H) 100 - 199 mg/dL   Triglycerides 559 (HH) 0 - 149 mg/dL   HDL 31 (L) >39 mg/dL   VLDL Cholesterol Cal 91 (H) 5 - 40 mg/dL   LDL Chol Calc (NIH) 85 0 - 99 mg/dL  T4, free     Status: None   Collection Time: 08/21/19  8:50 AM  Result Value Ref Range   Free T4 0.96 0.82 - 1.77 ng/dL  TSH     Status: None   Collection Time: 08/21/19  8:50 AM  Result Value Ref Range   TSH 2.320 0.450 - 4.500 uIU/mL  PSA     Status: None   Collection Time: 08/21/19  8:50 AM  Result Value Ref Range   Prostate Specific Ag, Serum 0.4 0.0 - 4.0 ng/mL    Comment: Roche ECLIA methodology. According to the American Urological Association, Serum PSA should decrease and remain at undetectable levels after radical prostatectomy. The AUA defines biochemical recurrence as an initial PSA value 0.2 ng/mL or greater followed by a subsequent confirmatory PSA value 0.2 ng/mL or greater. Values obtained with different assay methods or kits cannot be used interchangeably. Results cannot be interpreted as absolute evidence of the presence or absence of malignant disease.   Uric acid     Status: Abnormal   Collection Time: 08/21/19  8:50 AM  Result Value Ref Range    Uric Acid 9.2 (H) 3.8 - 8.4 mg/dL    Comment:            Therapeutic target for gout patients: <6.0  Urinalysis, Routine w reflex microscopic     Status: None   Collection Time: 08/29/19  8:32 AM  Result Value Ref Range   Specific Gravity, UA 1.022 1.005 - 1.030   pH, UA 5.5 5.0 - 7.5   Color, UA Yellow Yellow   Appearance Ur Clear Clear   Leukocytes,UA Negative Negative   Protein,UA Negative Negative/Trace   Glucose, UA Negative Negative   Ketones, UA Negative Negative   RBC, UA Negative Negative   Bilirubin, UA Negative Negative   Urobilinogen, Ur 0.2 0.2 - 1.0 mg/dL   Nitrite, UA Negative Negative   Microscopic Examination Comment     Comment: Microscopic not indicated and not performed.   Assessment/Plan: 1. Encounter for general adult medical examination with abnormal findings Annual health maintenance exam today.  2. Hypertriglyceridemia Severe elevation of triglycerides with fasting lipid panel. Start fenofibrate 16m daily with supper. Will recheck lipid panel prior to next visit and adjust medication as indicated.  - fenofibrate (TRICOR) 145 MG tablet; Take 1 tablet (145 mg total) by mouth daily.  Dispense: 90 tablet; Refill: 1  3. Essential hypertension Stable. Continue amlodipine and atenolol as prescribed. Refills provided today.  - amLODipine (NORVASC) 10 MG tablet; TAKE 1 TABLET BY MOUTH EVERYDAY AT BEDTIME  Dispense: 90 tablet; Refill: 1 - atenolol (TENORMIN) 50 MG tablet; Take 1 tablet (50 mg total) by  mouth daily.  Dispense: 90 tablet; Refill: 1  4. Chronic gout without tophus, unspecified cause, unspecified site Continue allopurinol 174m daily to prevent gout flares.  - allopurinol (ZYLOPRIM) 100 MG tablet; Take 1 tablet (100 mg total) by mouth daily.  Dispense: 90 tablet; Refill: 1  5. Chronic alcohol use Discussed the risk factors associated with long term, chronic alcohol use. Likely contributing to elevation in triglycerides and gout flares. Patient  voiced understanding and is planning to cur down chronic use.  6. Dysuria - Urinalysis, Routine w reflex microscopic  General Counseling: Nayshawn verbalizes understanding of the findings of todays visit and agrees with plan of treatment. I have discussed any further diagnostic evaluation that may be needed or ordered today. We also reviewed his medications today. he has been encouraged to call the office with any questions or concerns that should arise related to todays visit.    Counseling:  Hypertension Counseling:   The following hypertensive lifestyle modification were recommended and discussed:  1. Limiting alcohol intake to less than 1 oz/day of ethanol:(24 oz of beer or 8 oz of wine or 2 oz of 100-proof whiskey). 2. Take baby ASA 81 mg daily. 3. Importance of regular aerobic exercise and losing weight. 4. Reduce dietary saturated fat and cholesterol intake for overall cardiovascular health. 5. Maintaining adequate dietary potassium, calcium, and magnesium intake. 6. Regular monitoring of the blood pressure. 7. Reduce sodium intake to less than 100 mmol/day (less than 2.3 gm of sodium or less than 6 gm of sodium choride)   This patient [place] [facility]vera Guiseas a part of collaborative care agreement  Orders Placed This Encounter  Procedures   Urinalysis, Routine w reflex microscopic    Meds ordered this encounter  Medications   allopurinol (ZYLOPRIM) 100 MG tablet    Sig: Take 1 tablet (100 mg total) by mouth daily.    Dispense:  90 tablet    Refill:  1    Order Specific Question:   Supervising Provider    Answer:   KLavera Guise[[id number]]   amLODipine (NORVASC) 10 MG tablet    Sig: TAKE 1 TABLET BY MOUTH EVERYDAY AT BEDTIME    Dispense:  90 tablet    Refill:  1    Order Specific Question:   Supervising Provider    Answer:   KLavera Guise[[id number]]   atenolol (TENORMIN) 50 MG tablet    Sig: Take 1 tablet (50 mg total) by mouth  daily.    Dispense:  90 tablet    Refill:  1    Order Specific Question:   Supervising Provider    Answer:   KLavera Guise[[[id number]]  DISCONTD: fenofibrate (TRICOR) 145 MG tablet    Sig: Take 1 tablet (145 mg total) by mouth daily.    Dispense:  90 tablet    Refill:  1    Order Specific Question:   Supervising Provider    Answer:   KLavera Guise[[[id number]]   Total time spent: 489Minutes  Time spent includes review of chart, medications, test results, and follow up plan with the patient.     FLavera Guise MD  Internal Medicine

## 2019-08-30 ENCOUNTER — Telehealth: Payer: Self-pay

## 2019-08-30 ENCOUNTER — Other Ambulatory Visit: Payer: Self-pay

## 2019-08-30 LAB — URINALYSIS, ROUTINE W REFLEX MICROSCOPIC
Bilirubin, UA: NEGATIVE
Glucose, UA: NEGATIVE
Ketones, UA: NEGATIVE
Leukocytes,UA: NEGATIVE
Nitrite, UA: NEGATIVE
Protein,UA: NEGATIVE
RBC, UA: NEGATIVE
Specific Gravity, UA: 1.022 (ref 1.005–1.030)
Urobilinogen, Ur: 0.2 mg/dL (ref 0.2–1.0)
pH, UA: 5.5 (ref 5.0–7.5)

## 2019-08-30 MED ORDER — FENOFIBRIC ACID 135 MG PO CPDR: DELAYED_RELEASE_CAPSULE | ORAL | 1 refills | Status: DC

## 2019-08-30 NOTE — Telephone Encounter (Signed)
lmom that we send med to phar

## 2019-08-30 NOTE — Progress Notes (Signed)
These were fasting. I saw the patient Tuesday. He is regular drinker, he said approximately six beers per night. Started fenofibrate 163m and discussed drinking cessation with him.

## 2019-11-24 ENCOUNTER — Other Ambulatory Visit: Payer: Self-pay | Admitting: Nurse Practitioner

## 2019-11-24 DIAGNOSIS — E782 Mixed hyperlipidemia: Secondary | ICD-10-CM | POA: Diagnosis not present

## 2019-11-24 DIAGNOSIS — M1A9XX0 Chronic gout, unspecified, without tophus (tophi): Secondary | ICD-10-CM | POA: Diagnosis not present

## 2019-11-25 LAB — LIPID PANEL WITH LDL/HDL RATIO
Cholesterol, Total: 198 mg/dL (ref 100–199)
HDL: 47 mg/dL (ref >39)
LDL Chol Calc (NIH): 133 mg/dL — ABNORMAL HIGH (ref 0–99)
LDL/HDL Ratio: 2.8 ratio (ref 0.0–3.6)
Triglycerides: 97 mg/dL (ref 0–149)
VLDL Cholesterol Cal: 18 mg/dL (ref 5–40)

## 2019-11-25 LAB — COMPREHENSIVE METABOLIC PANEL
ALT: 28 IU/L (ref 0–44)
AST: 26 IU/L (ref 0–40)
Albumin/Globulin Ratio: 1.9 (ref 1.2–2.2)
Albumin: 5 g/dL (ref 4.0–5.0)
Alkaline Phosphatase: 58 IU/L (ref 44–121)
BUN/Creatinine Ratio: 21 — ABNORMAL HIGH (ref 9–20)
BUN: 25 mg/dL — ABNORMAL HIGH (ref 6–24)
Bilirubin Total: 0.4 mg/dL (ref 0.0–1.2)
CO2: 24 mmol/L (ref 20–29)
Calcium: 9.5 mg/dL (ref 8.7–10.2)
Chloride: 103 mmol/L (ref 96–106)
Creatinine, Ser: 1.19 mg/dL (ref 0.76–1.27)
GFR calc Af Amer: 85 mL/min/{1.73_m2} (ref >59)
GFR calc non Af Amer: 73 mL/min/{1.73_m2} (ref >59)
Globulin, Total: 2.6 g/dL (ref 1.5–4.5)
Glucose: 112 mg/dL — ABNORMAL HIGH (ref 65–99)
Potassium: 5 mmol/L (ref 3.5–5.2)
Sodium: 138 mmol/L (ref 134–144)
Total Protein: 7.6 g/dL (ref 6.0–8.5)

## 2019-11-25 LAB — URIC ACID: Uric Acid: 4.9 mg/dL (ref 3.8–8.4)

## 2019-11-27 ENCOUNTER — Ambulatory Visit: Payer: BC Managed Care – PPO | Admitting: Hospice and Palliative Medicine

## 2019-11-27 ENCOUNTER — Other Ambulatory Visit: Payer: Self-pay

## 2019-11-27 ENCOUNTER — Encounter: Payer: Self-pay | Admitting: Hospice and Palliative Medicine

## 2019-11-27 DIAGNOSIS — E783 Hyperchylomicronemia: Secondary | ICD-10-CM

## 2019-11-27 DIAGNOSIS — I10 Essential (primary) hypertension: Secondary | ICD-10-CM | POA: Diagnosis not present

## 2019-11-27 DIAGNOSIS — R7301 Impaired fasting glucose: Secondary | ICD-10-CM

## 2019-11-27 DIAGNOSIS — Z7289 Other problems related to lifestyle: Secondary | ICD-10-CM

## 2019-11-27 DIAGNOSIS — F1090 Alcohol use, unspecified, uncomplicated: Secondary | ICD-10-CM

## 2019-11-27 MED ORDER — FENOFIBRATE 48 MG PO TABS: 48.0000 mg | ORAL_TABLET | Freq: Every day | ORAL | 0 refills | Status: DC

## 2019-11-27 NOTE — Progress Notes (Signed)
[facility] [place], [place] [address]  Internal MEDICINE  Office Visit Note  Patient Name: Anthony Rios  [id number]  [id number]  Date of Service: 11/27/2019  Chief Complaint  Patient presents with  . Follow-up    review labs  . Hypertension  . Quality Metric Gaps    flu,tetnaus,covid    HPI Patient is here for routine follow-up He was recently started fenofibrate due to elevated triglyceride levels Has been on fenofibrate 135 mg for one month, had levels redrawn, there is much improvement No reports of negative side effects or complications from medication Uric acid level also much improved on recheck--has been adherent with allopurinol since last visit--no reports of gout attack Continues to drink about 6 beers per night   Current Medication: Outpatient Encounter Medications as of 11/27/2019  Medication Sig  . allopurinol (ZYLOPRIM) 100 MG tablet Take 1 tablet (100 mg total) by mouth daily.  Marland Kitchen amLODipine (NORVASC) 10 MG tablet TAKE 1 TABLET BY MOUTH EVERYDAY AT BEDTIME  . atenolol (TENORMIN) 50 MG tablet Take 1 tablet (50 mg total) by mouth daily.  . [DISCONTINUED] Choline Fenofibrate (FENOFIBRIC ACID) 135 MG CPDR Take 1 tab po daily  . [DISCONTINUED] indomethacin (INDOCIN) 50 MG capsule Take 1 capsule (50 mg total) by mouth 3 (three) times daily as needed for moderate pain.  . fenofibrate (TRICOR) 48 MG tablet Take 1 tablet (48 mg total) by mouth daily.   No facility-administered encounter medications on file as of 11/27/2019.    Surgical History: History reviewed. No pertinent surgical history.  Medical History: Past Medical History:  Diagnosis Date  . Arthritis   . Gout   . Hypertension     Family History: Family History  Problem Relation Age of Onset  . Hyperlipidemia Mother     Social History   Socioeconomic History  . Marital status: Single    Spouse name: Not on file  . Number of children: Not on file  . Years of  education: Not on file  . Highest education level: Not on file  Occupational History  . Not on file  Tobacco Use  . Smoking status: Former Research scientist (life sciences)  . Smokeless tobacco: Former Network engineer and Sexual Activity  . Alcohol use: Yes  . Drug use: Yes    Types: Marijuana  . Sexual activity: Not on file  Other Topics Concern  . Not on file  Social History Narrative  . Not on file   Social Determinants of Health   Financial Resource Strain:   . Difficulty of Paying Living Expenses: Not on file  Food Insecurity:   . Worried About Charity fundraiser in the Last Year: Not on file  . Ran Out of Food in the Last Year: Not on file  Transportation Needs:   . Lack of Transportation (Medical): Not on file  . Lack of Transportation (Non-Medical): Not on file  Physical Activity:   . Days of Exercise per Week: Not on file  . Minutes of Exercise per Session: Not on file  Stress:   . Feeling of Stress : Not on file  Social Connections:   . Frequency of Communication with Friends and Family: Not on file  . Frequency of Social Gatherings with Friends and Family: Not on file  . Attends Religious Services: Not on file  . Active Member of Clubs or Organizations: Not on file  . Attends Archivist Meetings: Not on file  . Marital Status: Not on file  Intimate Partner Violence:   . Fear of Current or Ex-Partner: Not on file  . Emotionally Abused: Not on file  . Physically Abused: Not on file  . Sexually Abused: Not on file      Review of Systems  Constitutional: Negative for chills, fatigue and unexpected weight change.  HENT: Negative for congestion, postnasal drip, rhinorrhea, sneezing and sore throat.   Eyes: Negative for photophobia, redness and visual disturbance.  Respiratory: Negative for cough, chest tightness and shortness of breath.   Cardiovascular: Negative for chest pain and palpitations.  Gastrointestinal: Negative for abdominal pain, constipation, diarrhea, nausea  and vomiting.  Genitourinary: Negative for dysuria and frequency.  Musculoskeletal: Negative for arthralgias, back pain, joint swelling and neck pain.  Skin: Negative for rash.  Neurological: Negative for dizziness, tremors, weakness, light-headedness and headaches.  Hematological: Negative for adenopathy. Does not bruise/bleed easily.  Psychiatric/Behavioral: Negative for behavioral problems (Depression), sleep disturbance and suicidal ideas. The patient is not nervous/anxious.     Vital Signs: BP 136/82   Pulse 73   Temp 98 F (36.7 C)   Resp 16   Ht 6' 2"  (1.88 m)   Wt 231 lb 6.4 oz (105 kg)   SpO2 99%   BMI 29.71 kg/m    Physical Exam Vitals reviewed.  Constitutional:      Appearance: Normal appearance. He is normal weight.  HENT:     Mouth/Throat:     Mouth: Mucous membranes are moist.     Pharynx: Oropharynx is clear.  Cardiovascular:     Rate and Rhythm: Normal rate and regular rhythm.     Pulses: Normal pulses.     Heart sounds: Normal heart sounds.  Pulmonary:     Effort: Pulmonary effort is normal.     Breath sounds: Normal breath sounds.  Abdominal:     General: Abdomen is flat.     Palpations: Abdomen is soft.  Musculoskeletal:        General: Normal range of motion.     Cervical back: Normal range of motion.  Neurological:     General: No focal deficit present.     Mental Status: He is alert and oriented to person, place, and time. Mental status is at baseline.  Psychiatric:        Mood and Affect: Mood normal.        Behavior: Behavior normal.        Thought Content: Thought content normal.    Assessment/Plan: 1. Hypertriglyceridemia, sporadic Triglyceride levels much improved with 1 month treatment of Fenofibrate, will decrease dose and recheck levels to ensure levels stay within normal range. Discussed importance of healthy diet and foods to avoid or to eat in moderation - Comprehensive Metabolic Panel (CMET) - Lipid Panel With LDL/HDL Ratio -  fenofibrate (TRICOR) 48 MG tablet; Take 1 tablet (48 mg total) by mouth daily.  Dispense: 90 tablet; Refill: 0  2. Elevated fasting glucose BG 112 on last fasting labs, will also review on recheck, if continued to be elevated will need to assess A1C level  3. Chronic alcohol use Discussed in depth the importance of decreasing his amount of alcohol consumption. Discussed the negative risks to his health heavy alcohol consumption can have. May need to consider screening colonoscopy--age of screening 44 Liver enzymes and platelet levels stable at this time, no indications for further work-up.  4. Essential hypertension BP and HR stable today on current therapy, will continue with routine monitoring.  General Counseling: Randel verbalizes understanding of  the findings of todays visit and agrees with plan of treatment. I have discussed any further diagnostic evaluation that may be needed or ordered today. We also reviewed his medications today. he has been encouraged to call the office with any questions or concerns that should arise related to todays visit.    Orders Placed This Encounter  Procedures  . Comprehensive Metabolic Panel (CMET)  . Lipid Panel With LDL/HDL Ratio    Meds ordered this encounter  Medications  . fenofibrate (TRICOR) 48 MG tablet    Sig: Take 1 tablet (48 mg total) by mouth daily.    Dispense:  90 tablet    Refill:  0    Time spent: 30 Minutes Time spent includes review of chart, medications, test results and follow-up plan with the patient.  This patient was seen by Theodoro Grist AGNP-C in Collaboration with Dr Lavera Guise as a part of collaborative care agreement     Tanna Furry. Juelz Claar AGNP-C Internal medicine

## 2019-11-28 NOTE — Progress Notes (Signed)
Reviewed. Discuss at next visit

## 2020-01-08 ENCOUNTER — Ambulatory Visit: Payer: BC Managed Care – PPO | Admitting: Nurse Practitioner

## 2020-01-08 ENCOUNTER — Other Ambulatory Visit: Payer: Self-pay

## 2020-02-26 ENCOUNTER — Other Ambulatory Visit: Payer: Self-pay

## 2020-02-26 DIAGNOSIS — I10 Essential (primary) hypertension: Secondary | ICD-10-CM

## 2020-02-26 DIAGNOSIS — M1A9XX0 Chronic gout, unspecified, without tophus (tophi): Secondary | ICD-10-CM

## 2020-02-26 MED ORDER — ALLOPURINOL 100 MG PO TABS: 100.0000 mg | ORAL_TABLET | Freq: Every day | ORAL | 1 refills | Status: DC

## 2020-02-26 MED ORDER — ATENOLOL 50 MG PO TABS: 50.0000 mg | ORAL_TABLET | Freq: Every day | ORAL | 1 refills | Status: DC

## 2020-02-27 ENCOUNTER — Other Ambulatory Visit: Payer: Self-pay | Admitting: Hospice and Palliative Medicine

## 2020-02-27 DIAGNOSIS — E783 Hyperchylomicronemia: Secondary | ICD-10-CM

## 2020-02-27 NOTE — Progress Notes (Signed)
Gout

## 2020-02-27 NOTE — Telephone Encounter (Signed)
This pt should be on every 6 months follow ups ( one CPE), needs follow up

## 2020-03-05 DIAGNOSIS — E783 Hyperchylomicronemia: Secondary | ICD-10-CM | POA: Diagnosis not present

## 2020-03-06 LAB — COMPREHENSIVE METABOLIC PANEL
ALT: 30 IU/L (ref 0–44)
AST: 27 IU/L (ref 0–40)
Albumin/Globulin Ratio: 1.4 (ref 1.2–2.2)
Albumin: 4.6 g/dL (ref 4.0–5.0)
Alkaline Phosphatase: 65 IU/L (ref 44–121)
BUN/Creatinine Ratio: 22 — ABNORMAL HIGH (ref 9–20)
BUN: 26 mg/dL — ABNORMAL HIGH (ref 6–24)
Bilirubin Total: 0.5 mg/dL (ref 0.0–1.2)
CO2: 22 mmol/L (ref 20–29)
Calcium: 9.8 mg/dL (ref 8.7–10.2)
Chloride: 101 mmol/L (ref 96–106)
Creatinine, Ser: 1.19 mg/dL (ref 0.76–1.27)
GFR calc Af Amer: 85 mL/min/{1.73_m2} (ref >59)
GFR calc non Af Amer: 73 mL/min/{1.73_m2} (ref >59)
Globulin, Total: 3.3 g/dL (ref 1.5–4.5)
Glucose: 100 mg/dL — ABNORMAL HIGH (ref 65–99)
Potassium: 5.2 mmol/L (ref 3.5–5.2)
Sodium: 138 mmol/L (ref 134–144)
Total Protein: 7.9 g/dL (ref 6.0–8.5)

## 2020-03-06 LAB — LIPID PANEL WITH LDL/HDL RATIO
Cholesterol, Total: 236 mg/dL — ABNORMAL HIGH (ref 100–199)
HDL: 46 mg/dL (ref >39)
LDL Chol Calc (NIH): 144 mg/dL — ABNORMAL HIGH (ref 0–99)
LDL/HDL Ratio: 3.1 ratio (ref 0.0–3.6)
Triglycerides: 255 mg/dL — ABNORMAL HIGH (ref 0–149)
VLDL Cholesterol Cal: 46 mg/dL — ABNORMAL HIGH (ref 5–40)

## 2020-03-07 ENCOUNTER — Ambulatory Visit (INDEPENDENT_AMBULATORY_CARE_PROVIDER_SITE_OTHER): Payer: BC Managed Care – PPO | Admitting: Internal Medicine

## 2020-03-07 ENCOUNTER — Encounter: Payer: Self-pay | Admitting: Internal Medicine

## 2020-03-07 VITALS — BP 140/82 | HR 68 | Temp 97.6°F | Resp 16 | Ht 74.0 in | Wt 237.6 lb

## 2020-03-07 DIAGNOSIS — E669 Obesity, unspecified: Secondary | ICD-10-CM | POA: Diagnosis not present

## 2020-03-07 DIAGNOSIS — I10 Essential (primary) hypertension: Secondary | ICD-10-CM | POA: Diagnosis not present

## 2020-03-07 DIAGNOSIS — M1A9XX0 Chronic gout, unspecified, without tophus (tophi): Secondary | ICD-10-CM

## 2020-03-07 DIAGNOSIS — E782 Mixed hyperlipidemia: Secondary | ICD-10-CM

## 2020-03-07 DIAGNOSIS — E66811 Obesity, class 1: Secondary | ICD-10-CM

## 2020-03-07 MED ORDER — ATENOLOL 50 MG PO TABS: 50.0000 mg | ORAL_TABLET | Freq: Every day | ORAL | 1 refills | Status: DC

## 2020-03-07 MED ORDER — AMLODIPINE BESYLATE 10 MG PO TABS: ORAL_TABLET | ORAL | 1 refills | Status: DC

## 2020-03-07 MED ORDER — ALLOPURINOL 100 MG PO TABS
100.0000 mg | ORAL_TABLET | Freq: Every day | ORAL | 1 refills | Status: DC
Start: 2020-03-07 — End: 2020-12-05

## 2020-03-07 MED ORDER — VASCEPA 0.5 G PO CAPS: 4.0000 | ORAL_CAPSULE | Freq: Two times a day (BID) | ORAL | 3 refills | Status: DC

## 2020-03-07 NOTE — Progress Notes (Signed)
Established Patient Office Visit  Subjective:  Patient ID: Anthony Rios, male    DOB: [birth year]  Age: 45 y.o. MRN: [id number]  CC:  Chief Complaint  Patient presents with  . Follow-up    Discuss meds, review labs  . Hypertension  . Quality Metric Gaps    Colonoscopy     HPI Anthony Rios presents for follow up on lab work and med check. He states he is doing well and has no complaints today. Upon review of labs his cholesterol and triglycerides were elevated again after trial of a lower dose of Fenofibrate. We discussed possibly changing medications to help better control these. He also requested a refill of his other medications. He is otherwise doing well and plans to return at his next physical.  Past Medical History:  Diagnosis Date  . Arthritis   . Gout   . Hypertension     History reviewed. No pertinent surgical history.  Family History  Problem Relation Age of Onset  . Hyperlipidemia Mother     Social History   Socioeconomic History  . Marital status: Single    Spouse name: Not on file  . Number of children: Not on file  . Years of education: Not on file  . Highest education level: Not on file  Occupational History  . Not on file  Tobacco Use  . Smoking status: Former Research scientist (life sciences)  . Smokeless tobacco: Former Network engineer and Sexual Activity  . Alcohol use: Yes  . Drug use: Yes    Types: Marijuana  . Sexual activity: Not on file  Other Topics Concern  . Not on file  Social History Narrative  . Not on file   Social Determinants of Health   Financial Resource Strain: Not on file  Food Insecurity: Not on file  Transportation Needs: Not on file  Physical Activity: Not on file  Stress: Not on file  Social Connections: Not on file  Intimate Partner Violence: Not on file    Outpatient Medications Prior to Visit  Medication Sig Dispense Refill  . fenofibrate (TRICOR) 48 MG tablet TAKE 1 TABLET BY MOUTH EVERY DAY 90 tablet 0  . allopurinol  (ZYLOPRIM) 100 MG tablet Take 1 tablet (100 mg total) by mouth daily. 90 tablet 1  . amLODipine (NORVASC) 10 MG tablet TAKE 1 TABLET BY MOUTH EVERYDAY AT BEDTIME 90 tablet 1  . atenolol (TENORMIN) 50 MG tablet Take 1 tablet (50 mg total) by mouth daily. 90 tablet 1   No facility-administered medications prior to visit.    Allergies  Allergen Reactions  . Ibuprofen     ROS Review of Systems  Constitutional: Negative for activity change, appetite change, fatigue, fever and unexpected weight change.  HENT: Negative for congestion, rhinorrhea and sinus pain.   Respiratory: Negative for cough and shortness of breath.   Cardiovascular: Negative for chest pain and palpitations.  Gastrointestinal: Negative for abdominal pain.  Musculoskeletal: Negative for arthralgias.  Skin: Negative for color change.  Neurological: Negative for dizziness and headaches.  Psychiatric/Behavioral: Negative for behavioral problems. The patient is not nervous/anxious.       Objective:    Physical Exam Constitutional:      Appearance: Normal appearance. He is normal weight.  HENT:     Head: Normocephalic and atraumatic.  Eyes:     Extraocular Movements: Extraocular movements intact.     Pupils: Pupils are equal, round, and reactive to light.  Cardiovascular:     Rate and Rhythm: Normal rate  and regular rhythm.     Heart sounds: Normal heart sounds.  Pulmonary:     Effort: Pulmonary effort is normal.     Breath sounds: Normal breath sounds.  Musculoskeletal:        General: Normal range of motion.     Cervical back: Normal range of motion and neck supple.  Neurological:     General: No focal deficit present.     Mental Status: He is alert.  Psychiatric:        Mood and Affect: Mood normal.        Behavior: Behavior normal.        Thought Content: Thought content normal.        Judgment: Judgment normal.     BP 140/82   Pulse 68   Temp 97.6 F (36.4 C)   Resp 16   Ht 6' 2"  (1.88 m)   Wt  237 lb 9.6 oz (107.8 kg)   SpO2 98%   BMI 30.51 kg/m  Wt Readings from Last 3 Encounters:  03/07/20 237 lb 9.6 oz (107.8 kg)  11/27/19 231 lb 6.4 oz (105 kg)  08/29/19 235 lb 3.2 oz (106.7 kg)     Health Maintenance Due  Topic Date Due  . HIV Screening  Never done  . TETANUS/TDAP  Never done  . COLONOSCOPY (Pts 46-29yr Insurance coverage will need to be confirmed)  Never done    There are no preventive care reminders to display for this patient.  Lab Results  Component Value Date   TSH 2.320 08/21/2019   Lab Results  Component Value Date   WBC 6.3 08/21/2019   HGB 14.4 08/21/2019   HCT 43.9 08/21/2019   MCV 87 08/21/2019   PLT 180 08/21/2019   Lab Results  Component Value Date   NA 138 03/05/2020   K 5.2 03/05/2020   CO2 22 03/05/2020   GLUCOSE 100 (H) 03/05/2020   BUN 26 (H) 03/05/2020   CREATININE 1.19 03/05/2020   BILITOT 0.5 03/05/2020   ALKPHOS 65 03/05/2020   AST 27 03/05/2020   ALT 30 03/05/2020   PROT 7.9 03/05/2020   ALBUMIN 4.6 03/05/2020   CALCIUM 9.8 03/05/2020   Lab Results  Component Value Date   CHOL 236 (H) 03/05/2020   Lab Results  Component Value Date   HDL 46 03/05/2020   Lab Results  Component Value Date   LDLCALC 144 (H) 03/05/2020   Lab Results  Component Value Date   TRIG 255 (H) 03/05/2020   No results found for: CHOLHDL No results found for: HGBA1C    Assessment & Plan:   1. Elevated cholesterol with elevated triglycerides Lab work today shows elevated cholesterol and triglycerides. Gave samples of Vascepa and will trial this instead of Fenofibrate. - Icosapent Ethyl (VASCEPA) 0.5 g CAPS; Take 4 capsules by mouth 2 (two) times daily.  Dispense: 240 capsule; Refill: 3  2. Obesity (BMI 30.0-34.9)  Obesity Counseling: Risk Assessment: An assessment of behavioral risk factors was made today and includes lack of exercise sedentary lifestyle, lack of portion control and poor dietary habits.  Risk Modification Advice:  He was counseled on portion control guidelines. Restricting daily caloric intake to 2000. The detrimental long term effects of obesity on his health and ongoing poor compliance was also discussed with the patient.   3. Essential hypertension Stable. Continue current medications. - amLODipine (NORVASC) 10 MG tablet; TAKE 1 TABLET BY MOUTH EVERYDAY AT BEDTIME  Dispense: 90 tablet; Refill: 1 -  atenolol (TENORMIN) 50 MG tablet; Take 1 tablet (50 mg total) by mouth daily.  Dispense: 90 tablet; Refill: 1 Hypertension Counseling:   The following hypertensive lifestyle modification were recommended and discussed:  1. Limiting alcohol intake to less than 1 oz/day of ethanol:(24 oz of beer or 8 oz of wine or 2 oz of 100-proof whiskey). 2. Take baby ASA 81 mg daily. 3. Importance of regular aerobic exercise and losing weight. 4. Reduce dietary saturated fat and cholesterol intake for overall cardiovascular health. 5. Maintaining adequate dietary potassium, calcium, and magnesium intake. 6. Regular monitoring of the blood pressure. 7. 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 Continue current medications. - allopurinol (ZYLOPRIM) 100 MG tablet; Take 1 tablet (100 mg total) by mouth daily.  Dispense: 90 tablet; Refill: 1  Meds ordered this encounter  Medications  . allopurinol (ZYLOPRIM) 100 MG tablet    Sig: Take 1 tablet (100 mg total) by mouth daily.    Dispense:  90 tablet    Refill:  1  . Icosapent Ethyl (VASCEPA) 0.5 g CAPS    Sig: Take 4 capsules by mouth 2 (two) times daily.    Dispense:  240 capsule    Refill:  3  . amLODipine (NORVASC) 10 MG tablet    Sig: TAKE 1 TABLET BY MOUTH EVERYDAY AT BEDTIME    Dispense:  90 tablet    Refill:  1  . atenolol (TENORMIN) 50 MG tablet    Sig: Take 1 tablet (50 mg total) by mouth daily.     Dispense:  90 tablet    Refill:  1    Follow-up: Will return for scheduled physical.   Clayborn Bigness, MD

## 2020-03-08 ENCOUNTER — Telehealth: Payer: Self-pay

## 2020-03-08 NOTE — Telephone Encounter (Signed)
PA approved for VASCEPA 0.5 mg capsules valid from 03/08/2020 to 03/07/2021

## 2020-05-31 ENCOUNTER — Other Ambulatory Visit: Payer: Self-pay | Admitting: Internal Medicine

## 2020-05-31 DIAGNOSIS — E783 Hyperchylomicronemia: Secondary | ICD-10-CM

## 2020-09-04 IMAGING — CR DG SHOULDER 2+V*R*
1 series · 3 of 3 positions shown · non-contrast
Comparison: No recent prior.

CLINICAL DATA: Acute pain right shoulder.

EXAM:
RIGHT SHOULDER - 2+ VIEW

[Series 1: dg shoulder right · 0.14mm/px · 3 of 3 slices shown]
[im 1/3]
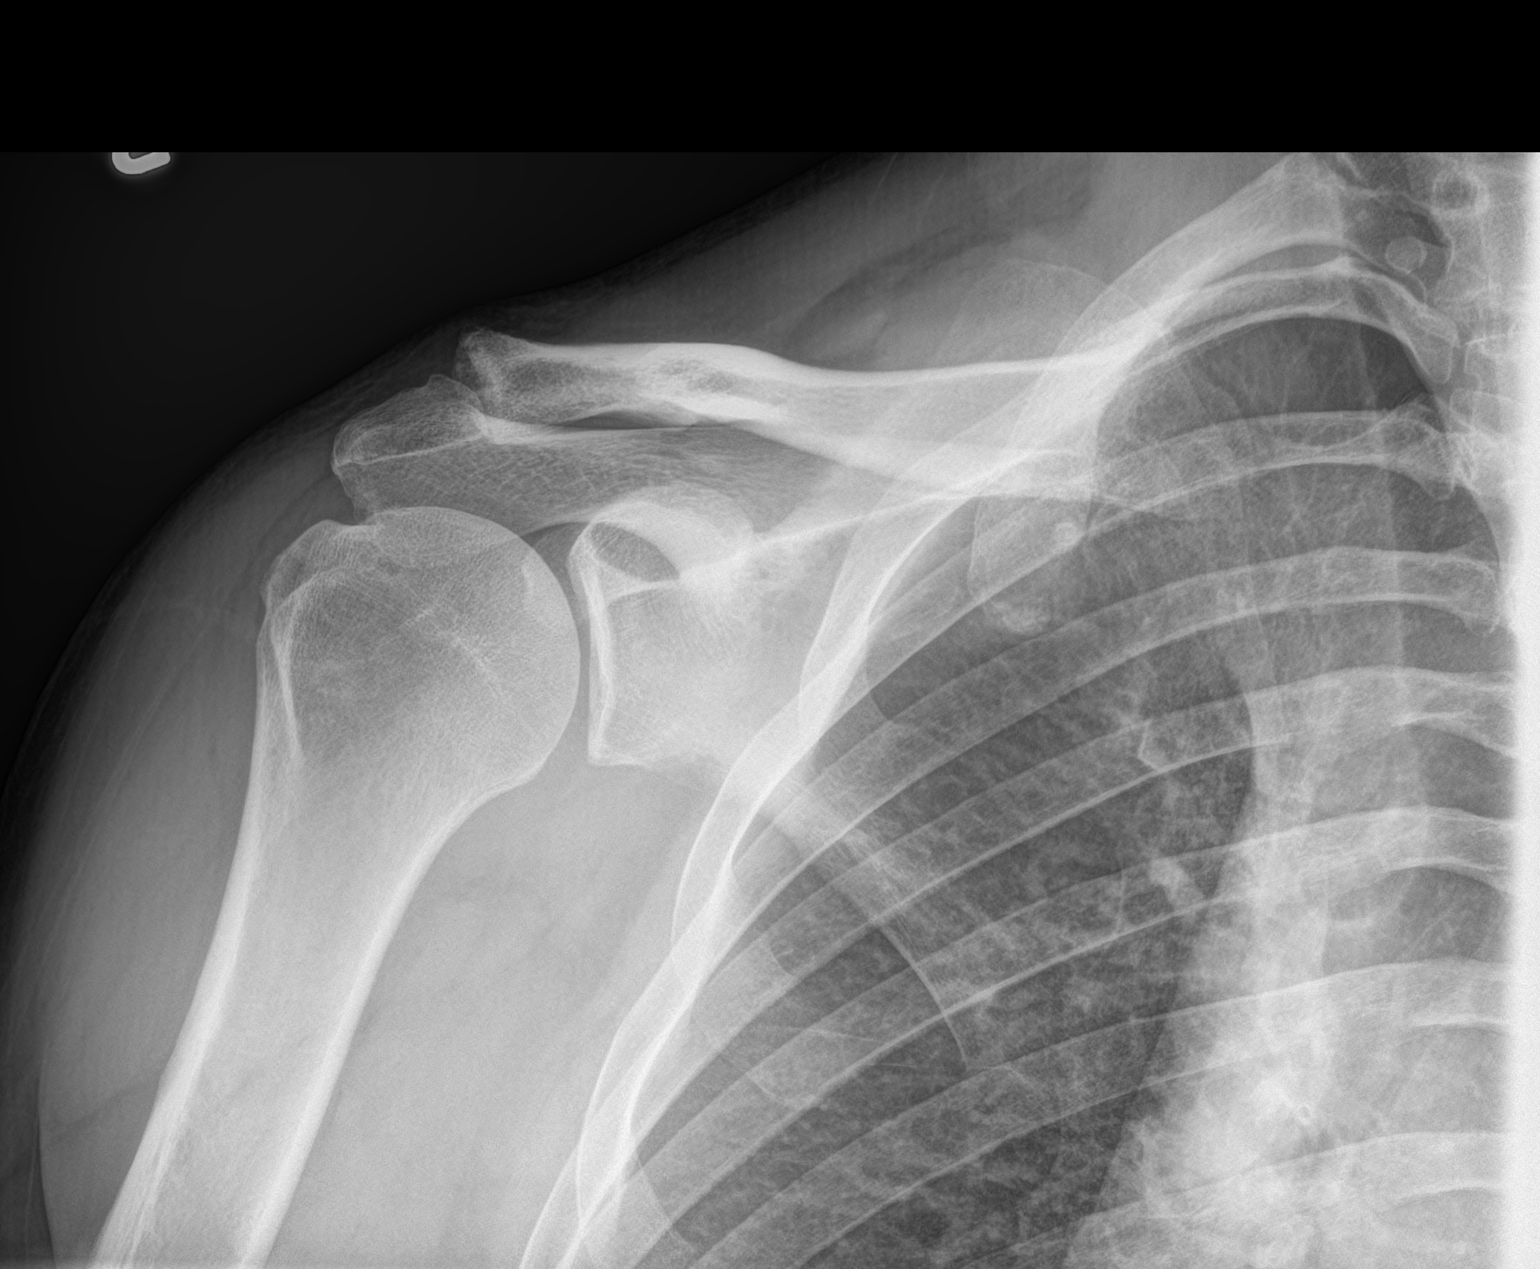
[im 2/3]
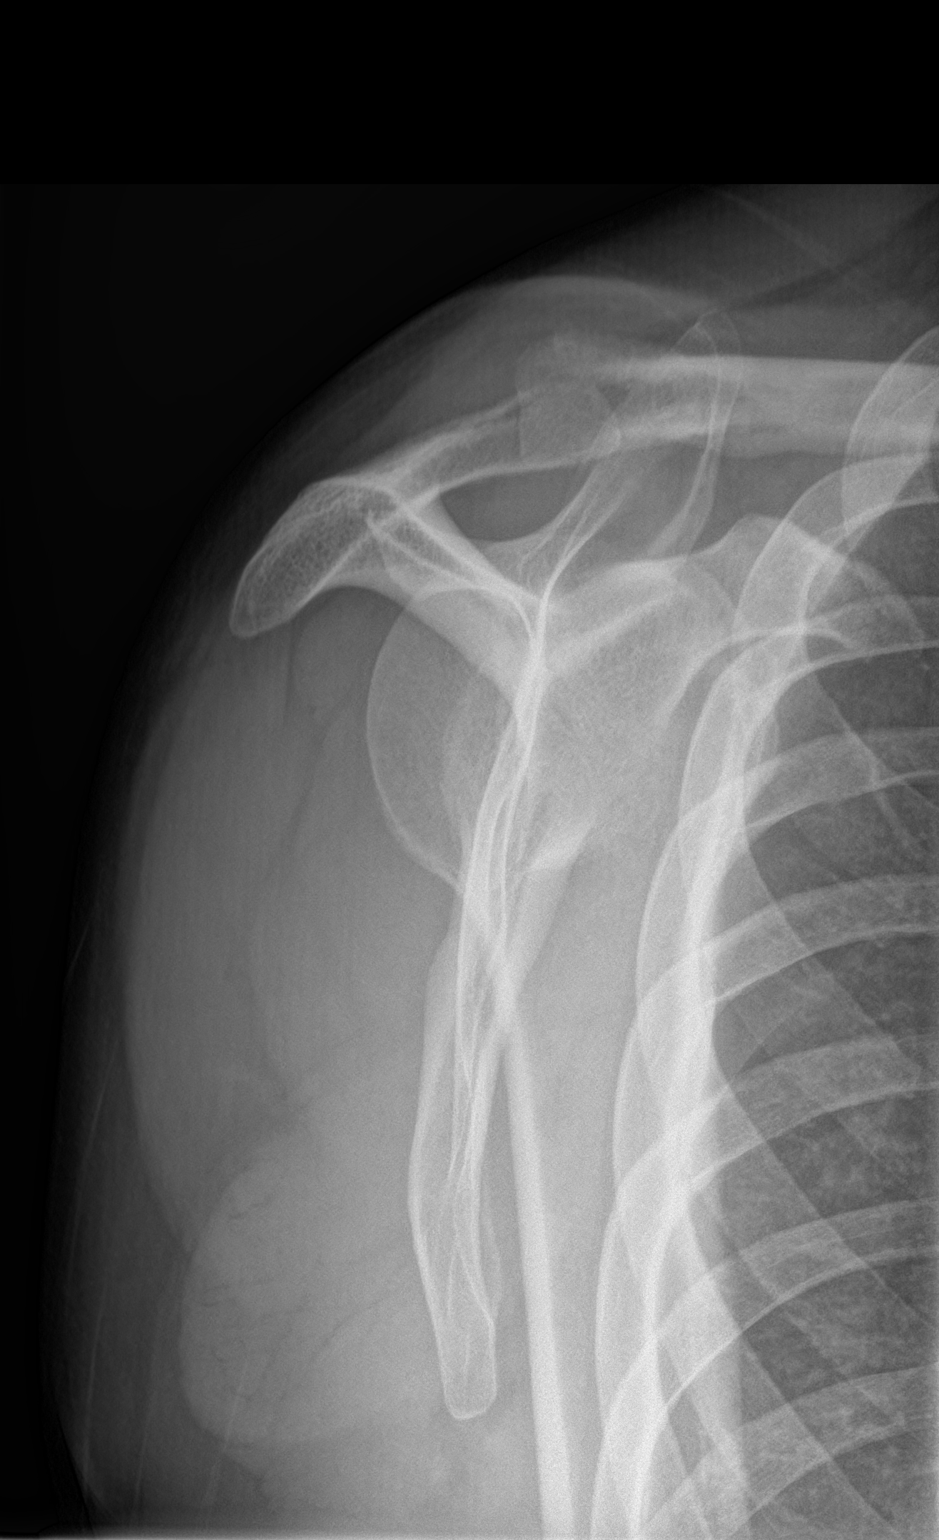
[im 3/3]
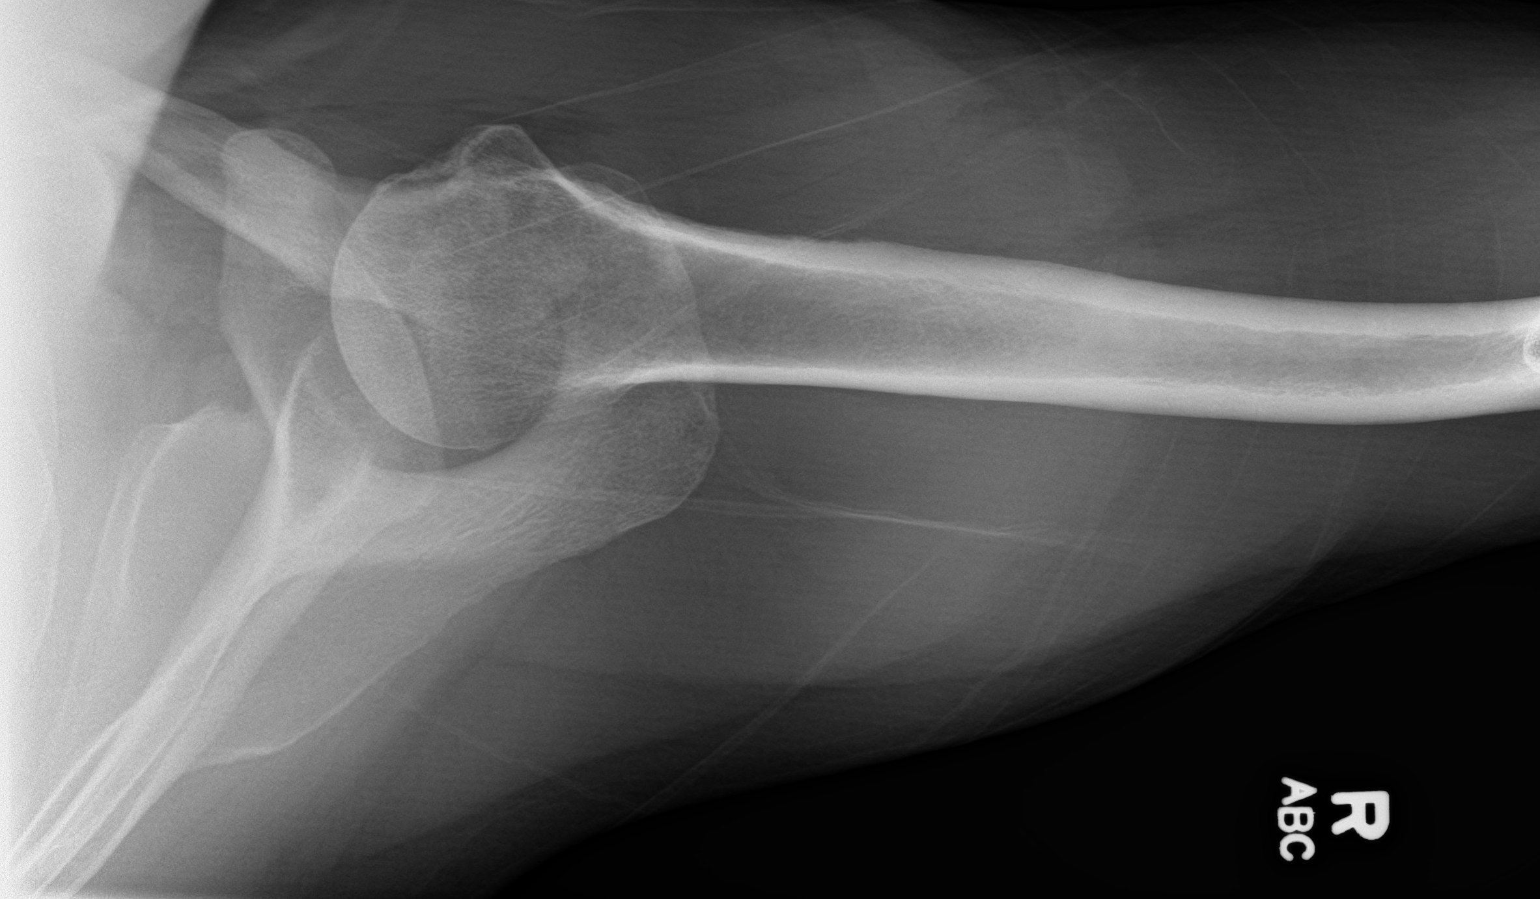

[3 of 3 positions shown; findings below may reference images not displayed]

FINDINGS: No acute bony or joint abnormality identified. No evidence of
fracture or dislocation.
IMPRESSION: No acute or focal abnormality.

## 2020-09-06 ENCOUNTER — Encounter: Payer: BC Managed Care – PPO | Admitting: Physician Assistant

## 2020-09-11 ENCOUNTER — Other Ambulatory Visit: Payer: Self-pay | Admitting: Internal Medicine

## 2020-09-11 ENCOUNTER — Other Ambulatory Visit: Payer: Self-pay

## 2020-09-11 DIAGNOSIS — E783 Hyperchylomicronemia: Secondary | ICD-10-CM

## 2020-09-20 ENCOUNTER — Other Ambulatory Visit: Payer: Self-pay | Admitting: Internal Medicine

## 2020-09-20 DIAGNOSIS — I10 Essential (primary) hypertension: Secondary | ICD-10-CM

## 2020-09-20 NOTE — Telephone Encounter (Signed)
PT NEED APPT FOR REFILLS

## 2020-10-06 ENCOUNTER — Other Ambulatory Visit: Payer: Self-pay | Admitting: Internal Medicine

## 2020-10-06 DIAGNOSIS — E783 Hyperchylomicronemia: Secondary | ICD-10-CM

## 2020-10-13 ENCOUNTER — Other Ambulatory Visit: Payer: Self-pay | Admitting: Internal Medicine

## 2020-10-13 DIAGNOSIS — E783 Hyperchylomicronemia: Secondary | ICD-10-CM

## 2020-10-15 ENCOUNTER — Telehealth: Payer: Self-pay

## 2020-10-15 ENCOUNTER — Other Ambulatory Visit: Payer: Self-pay | Admitting: Internal Medicine

## 2020-10-15 DIAGNOSIS — E783 Hyperchylomicronemia: Secondary | ICD-10-CM

## 2020-10-15 NOTE — Telephone Encounter (Signed)
Pt need a follow up appt for refills

## 2020-10-15 NOTE — Telephone Encounter (Signed)
Pt needs AWV, please decline medicine

## 2020-10-15 NOTE — Telephone Encounter (Signed)
Try to call  pt phone is not right send 30 days and put message that pt need appt for refills if pt called back he need physical appt

## 2020-11-07 ENCOUNTER — Other Ambulatory Visit: Payer: Self-pay | Admitting: Internal Medicine

## 2020-11-07 DIAGNOSIS — E783 Hyperchylomicronemia: Secondary | ICD-10-CM

## 2020-11-27 ENCOUNTER — Other Ambulatory Visit: Payer: Self-pay

## 2020-11-27 DIAGNOSIS — E783 Hyperchylomicronemia: Secondary | ICD-10-CM

## 2020-11-27 MED ORDER — FENOFIBRATE 48 MG PO TABS: 48.0000 mg | ORAL_TABLET | Freq: Every day | ORAL | 0 refills | Status: DC

## 2020-12-05 ENCOUNTER — Other Ambulatory Visit: Payer: Self-pay

## 2020-12-05 ENCOUNTER — Encounter: Payer: Self-pay | Admitting: Nurse Practitioner

## 2020-12-05 ENCOUNTER — Ambulatory Visit (INDEPENDENT_AMBULATORY_CARE_PROVIDER_SITE_OTHER): Payer: BC Managed Care – PPO | Admitting: Nurse Practitioner

## 2020-12-05 VITALS — BP 130/80 | HR 61 | Temp 98.8°F | Resp 16 | Ht 74.0 in | Wt 244.8 lb

## 2020-12-05 DIAGNOSIS — F1090 Alcohol use, unspecified, uncomplicated: Secondary | ICD-10-CM | POA: Diagnosis not present

## 2020-12-05 DIAGNOSIS — M1A9XX0 Chronic gout, unspecified, without tophus (tophi): Secondary | ICD-10-CM

## 2020-12-05 DIAGNOSIS — Z1211 Encounter for screening for malignant neoplasm of colon: Secondary | ICD-10-CM | POA: Diagnosis not present

## 2020-12-05 DIAGNOSIS — Z1212 Encounter for screening for malignant neoplasm of rectum: Secondary | ICD-10-CM

## 2020-12-05 DIAGNOSIS — Z23 Encounter for immunization: Secondary | ICD-10-CM

## 2020-12-05 DIAGNOSIS — E559 Vitamin D deficiency, unspecified: Secondary | ICD-10-CM

## 2020-12-05 DIAGNOSIS — E783 Hyperchylomicronemia: Secondary | ICD-10-CM

## 2020-12-05 DIAGNOSIS — I10 Essential (primary) hypertension: Secondary | ICD-10-CM | POA: Diagnosis not present

## 2020-12-05 DIAGNOSIS — R7301 Impaired fasting glucose: Secondary | ICD-10-CM | POA: Diagnosis not present

## 2020-12-05 DIAGNOSIS — E782 Mixed hyperlipidemia: Secondary | ICD-10-CM

## 2020-12-05 MED ORDER — AMLODIPINE BESYLATE 10 MG PO TABS: 10.0000 mg | ORAL_TABLET | Freq: Every day | ORAL | 1 refills | Status: DC

## 2020-12-05 MED ORDER — ALLOPURINOL 100 MG PO TABS: 100.0000 mg | ORAL_TABLET | Freq: Every day | ORAL | 1 refills | Status: DC

## 2020-12-05 MED ORDER — VASCEPA 0.5 G PO CAPS: 4.0000 | ORAL_CAPSULE | Freq: Two times a day (BID) | ORAL | 3 refills | Status: DC

## 2020-12-05 MED ORDER — FENOFIBRATE 48 MG PO TABS: 48.0000 mg | ORAL_TABLET | Freq: Every day | ORAL | 1 refills | Status: DC

## 2020-12-05 MED ORDER — ATENOLOL 50 MG PO TABS: 50.0000 mg | ORAL_TABLET | Freq: Every day | ORAL | 1 refills | Status: DC

## 2020-12-05 MED ORDER — TETANUS-DIPHTH-ACELL PERTUSSIS 5-2.5-18.5 LF-MCG/0.5 IM SUSP: 0.5000 mL | Freq: Once | INTRAMUSCULAR | 0 refills | Status: AC

## 2020-12-05 NOTE — Progress Notes (Signed)
[facility] [place], [place] [address]  Internal MEDICINE  Office Visit Note  Patient Name: Anthony Rios  [id number]  [id number]  Date of Service: 12/05/2020  Chief Complaint  Patient presents with   Follow-up    Med refill   Hypertension    HPI Azariel presents for a follow up visit for medication refills and hypertension. He missed his scheduled annual physical in July. He has been having issues with getting his fenofibrate refilled but all of his medications are due for refills now.  His blood pressure is elevated today but it was initially checked through the sleeve of his sweatshirt. When rechecked, it improved, see vitals. He is due for colorectal cancer screening and does not have any family history of colorectal cancer. He is overdue for his annual physical exam so his next office visit will be a CPE. He drinks about 6-8 beers at the end of the day.   [place] Visit from 12/05/2020 in [facility], [facility]  AUDIT-C Score [place] Office Visit from 12/05/2020 in [facility], [facility]  Alcohol Use Disorder Identification Test Final Score (AUDIT) 16        Current Medication: Outpatient Encounter Medications as of 12/05/2020  Medication Sig   [DISCONTINUED] allopurinol (ZYLOPRIM) 100 MG tablet Take 1 tablet (100 mg total) by mouth daily.   [DISCONTINUED] amLODipine (NORVASC) 10 MG tablet TAKE 1 TABLET BY MOUTH EVERYDAY AT BEDTIME   [DISCONTINUED] atenolol (TENORMIN) 50 MG tablet Take 1 tablet (50 mg total) by mouth daily.   [DISCONTINUED] fenofibrate (TRICOR) 48 MG tablet Take 1 tablet (48 mg total) by mouth daily.   [DISCONTINUED] Icosapent Ethyl (VASCEPA) 0.5 g CAPS Take 4 capsules by mouth 2 (two) times daily.   [DISCONTINUED] Tdap (BOOSTRIX) 5-2.5-18.5 LF-MCG/0.5 injection Inject 0.5 mLs into the muscle once.   allopurinol (ZYLOPRIM) 100 MG tablet Take 1 tablet (100 mg total) by mouth daily.    amLODipine (NORVASC) 10 MG tablet Take 1 tablet (10 mg total) by mouth daily.   atenolol (TENORMIN) 50 MG tablet Take 1 tablet (50 mg total) by mouth daily.   fenofibrate (TRICOR) 48 MG tablet Take 1 tablet (48 mg total) by mouth daily.   Icosapent Ethyl (VASCEPA) 0.5 g CAPS Take 4 capsules by mouth 2 (two) times daily.   [EXPIRED] Tdap (BOOSTRIX) 5-2.5-18.5 LF-MCG/0.5 injection Inject 0.5 mLs into the muscle once for 1 dose.   No facility-administered encounter medications on file as of 12/05/2020.    Surgical History: History reviewed. No pertinent surgical history.  Medical History: Past Medical History:  Diagnosis Date   Arthritis    Gout    Hypertension     Family History: Family History  Problem Relation Age of Onset   Hyperlipidemia Mother     Social History   Socioeconomic History   Marital status: Single    Spouse name: Not on file   Number of children: Not on file   Years of education: Not on file   Highest education level: Not on file  Occupational History   Not on file  Tobacco Use   Smoking status: Former   Smokeless tobacco: Never  Substance and Sexual Activity   Alcohol use: Yes   Drug use: Yes    Types: Marijuana   Sexual activity: Not on file  Other Topics Concern   Not on file  Social History Narrative   Not on file   Social Determinants  of Health   Financial Resource Strain: Not on file  Food Insecurity: Not on file  Transportation Needs: Not on file  Physical Activity: Not on file  Stress: Not on file  Social Connections: Not on file  Intimate Partner Violence: Not on file      Review of Systems  Constitutional:  Negative for chills, fatigue and unexpected weight change.  HENT:  Negative for congestion, rhinorrhea, sneezing and sore throat.   Eyes:  Negative for redness.  Respiratory:  Negative for cough, chest tightness and shortness of breath.   Cardiovascular:  Negative for chest pain and palpitations.  Gastrointestinal:   Negative for abdominal pain, constipation, diarrhea, nausea and vomiting.  Genitourinary:  Negative for dysuria and frequency.  Musculoskeletal:  Negative for arthralgias, back pain, joint swelling and neck pain.  Skin:  Negative for rash.  Neurological: Negative.  Negative for tremors and numbness.  Hematological:  Negative for adenopathy. Does not bruise/bleed easily.  Psychiatric/Behavioral:  Negative for behavioral problems (Depression), sleep disturbance and suicidal ideas. The patient is not nervous/anxious.    Vital Signs: BP 130/80 Comment: 156/90  Pulse 61   Temp 98.8 F (37.1 C)   Resp 16   Ht 6' 2"  (1.88 m)   Wt 244 lb 12.8 oz (111 kg)   SpO2 99%   BMI 31.43 kg/m    Physical Exam Vitals reviewed.  Constitutional:      General: He is not in acute distress.    Appearance: Normal appearance. He is obese. He is not ill-appearing.  HENT:     Head: Normocephalic and atraumatic.  Eyes:     Extraocular Movements: Extraocular movements intact.     Pupils: Pupils are equal, round, and reactive to light.  Cardiovascular:     Rate and Rhythm: Normal rate and regular rhythm.  Pulmonary:     Effort: Pulmonary effort is normal. No respiratory distress.  Neurological:     Mental Status: He is alert and oriented to person, place, and time.     Cranial Nerves: No cranial nerve deficit.     Coordination: Coordination normal.     Gait: Gait normal.  Psychiatric:        Mood and Affect: Mood normal.        Behavior: Behavior normal.       Assessment/Plan: 1. Essential hypertension Stable with current medications, continue as prescribed, refills ordered.  - atenolol (TENORMIN) 50 MG tablet; Take 1 tablet (50 mg total) by mouth daily.  Dispense: 90 tablet; Refill: 1 - amLODipine (NORVASC) 10 MG tablet; Take 1 tablet (10 mg total) by mouth daily.  Dispense: 90 tablet; Refill: 1  2. Elevated fasting glucose Routine lab ordered. - TSH + free T4  3. Chronic alcohol  use Significant nightly alcohol use, labs ordered to assess for abnormalities - CBC with Differential/Platelet - CMP14+EGFR  4. Elevated cholesterol with elevated triglycerides Patient take fenofibrate and prescription strength fish oil supplement, refills ordered.  - fenofibrate (TRICOR) 48 MG tablet; Take 1 tablet (48 mg total) by mouth daily.  Dispense: 90 tablet; Refill: 1 - Icosapent Ethyl (VASCEPA) 0.5 g CAPS; Take 4 capsules by mouth 2 (two) times daily.  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  Dispense: 90 tablet; Refill: 1  6. Vitamin D deficiency Rule out low vitamin  D - Vitamin D (25 hydroxy)  7. Screening for colorectal cancer GI referral ordered for routine colonoscopy.  - Ambulatory referral to Gastroenterology  8. Need for vaccination - Tdap ([place]) 5-2.5-18.5 LF-MCG/0.5 injection; Inject 0.5 mLs into the muscle once for 1 dose.  Dispense: 0.5 mL; Refill: 0   General Counseling: Kailen verbalizes understanding of the findings of todays visit and agrees with plan of treatment. I have discussed any further diagnostic evaluation that may be needed or ordered today. We also reviewed his medications today. he has been encouraged to call the office with any questions or concerns that should arise related to todays visit.    Orders Placed This Encounter  Procedures   CBC with Differential/Platelet   CMP14+EGFR   Lipid Profile   Vitamin D (25 hydroxy)   TSH + free T4   Ambulatory referral to Gastroenterology    Meds ordered this encounter  Medications   Tdap (BOOSTRIX) 5-2.5-18.5 LF-MCG/0.5 injection    Sig: Inject 0.5 mLs into the muscle once for 1 dose.    Dispense:  0.5 mL    Refill:  0   fenofibrate (TRICOR) 48 MG tablet    Sig: Take 1 tablet (48 mg  total) by mouth daily.    Dispense:  90 tablet    Refill:  1   atenolol (TENORMIN) 50 MG tablet    Sig: Take 1 tablet (50 mg total) by mouth daily.    Dispense:  90 tablet    Refill:  1   Icosapent Ethyl (VASCEPA) 0.5 g CAPS    Sig: Take 4 capsules by mouth 2 (two) times daily.    Dispense:  240 capsule    Refill:  3   allopurinol (ZYLOPRIM) 100 MG tablet    Sig: Take 1 tablet (100 mg total) by mouth daily.    Dispense:  90 tablet    Refill:  1   amLODipine (NORVASC) 10 MG tablet    Sig: Take 1 tablet (10 mg total) by mouth daily.    Dispense:  90 tablet    Refill:  1    Return in about 3 months (around 03/07/2021) for CPE, [place] PCP.   Total time spent:30 Minutes Time spent includes review of chart, medications, test results, and follow up plan with the patient.   [place] Controlled Substance Database was reviewed by me.  This patient was seen by Jonetta Osgood, FNP-C in collaboration with Dr. Clayborn Bigness as a part of collaborative care agreement.   Ellicia Alix R. Valetta Fuller, MSN, FNP-C Internal medicine

## 2021-02-25 ENCOUNTER — Telehealth: Payer: Self-pay

## 2021-02-25 NOTE — Telephone Encounter (Signed)
LMOM for pt to return my call to check if his insurance has changed.  Trying to do PA for VASCEPA 0.5gm

## 2021-03-03 ENCOUNTER — Telehealth: Payer: Self-pay

## 2021-03-03 NOTE — Telephone Encounter (Signed)
Left vm and sent mychart message to confirm 03/05/21 appointment-Toni

## 2021-03-05 ENCOUNTER — Ambulatory Visit (INDEPENDENT_AMBULATORY_CARE_PROVIDER_SITE_OTHER): Payer: BC Managed Care – PPO | Admitting: Nurse Practitioner

## 2021-03-05 ENCOUNTER — Other Ambulatory Visit: Payer: Self-pay

## 2021-03-05 ENCOUNTER — Encounter: Payer: Self-pay | Admitting: Nurse Practitioner

## 2021-03-05 VITALS — BP 150/88 | HR 75 | Temp 98.6°F | Resp 16 | Ht 74.0 in | Wt 239.8 lb

## 2021-03-05 DIAGNOSIS — E559 Vitamin D deficiency, unspecified: Secondary | ICD-10-CM | POA: Diagnosis not present

## 2021-03-05 DIAGNOSIS — R300 Dysuria: Secondary | ICD-10-CM | POA: Diagnosis not present

## 2021-03-05 DIAGNOSIS — Z0001 Encounter for general adult medical examination with abnormal findings: Secondary | ICD-10-CM

## 2021-03-05 DIAGNOSIS — E781 Pure hyperglyceridemia: Secondary | ICD-10-CM

## 2021-03-05 DIAGNOSIS — Z1212 Encounter for screening for malignant neoplasm of rectum: Secondary | ICD-10-CM

## 2021-03-05 DIAGNOSIS — E669 Obesity, unspecified: Secondary | ICD-10-CM

## 2021-03-05 DIAGNOSIS — E66811 Obesity, class 1: Secondary | ICD-10-CM

## 2021-03-05 DIAGNOSIS — I10 Essential (primary) hypertension: Secondary | ICD-10-CM

## 2021-03-05 DIAGNOSIS — E782 Mixed hyperlipidemia: Secondary | ICD-10-CM | POA: Diagnosis not present

## 2021-03-05 DIAGNOSIS — R7301 Impaired fasting glucose: Secondary | ICD-10-CM | POA: Diagnosis not present

## 2021-03-05 DIAGNOSIS — M1A9XX0 Chronic gout, unspecified, without tophus (tophi): Secondary | ICD-10-CM

## 2021-03-05 DIAGNOSIS — Z1211 Encounter for screening for malignant neoplasm of colon: Secondary | ICD-10-CM | POA: Diagnosis not present

## 2021-03-05 DIAGNOSIS — F1090 Alcohol use, unspecified, uncomplicated: Secondary | ICD-10-CM | POA: Diagnosis not present

## 2021-03-05 NOTE — Progress Notes (Signed)
[facility] [place], [place] [address]  Internal MEDICINE  Office Visit Note  Patient Name: Anthony Rios  [id number]  [id number]  Date of Service: 03/05/2021  Chief Complaint  Patient presents with   Annual Exam   Hypertension    HPI Espen presents for an annual well visit and physical exam. He is a well appearing 46 yo male with hypertension, hyperlipidemia and gout. His blood pressure is up today but he took his medications just before coming in for his appointment. He is taking amlodipine and atenolol for high blood pressure. He is overdue for routine labs which were previously ordered.  He was referred to GI for routine colonoscopy but had difficulty talking to the clinic on the phone and would miss phone calls and then call back and leave a message, wants to try cologard instead. He is not due for any other preventive screenings at this time.  He denies any pain. He has no other questions or concerns. He is fasting and plans on having his labs done after his appt today.    Current Medication: Outpatient Encounter Medications as of 03/05/2021  Medication Sig   allopurinol (ZYLOPRIM) 100 MG tablet Take 1 tablet (100 mg total) by mouth daily.   amLODipine (NORVASC) 10 MG tablet Take 1 tablet (10 mg total) by mouth daily.   atenolol (TENORMIN) 50 MG tablet Take 1 tablet (50 mg total) by mouth daily.   fenofibrate (TRICOR) 48 MG tablet Take 1 tablet (48 mg total) by mouth daily.   Icosapent Ethyl (VASCEPA) 0.5 g CAPS Take 4 capsules by mouth 2 (two) times daily.   No facility-administered encounter medications on file as of 03/05/2021.    Surgical History: History reviewed. No pertinent surgical history.  Medical History: Past Medical History:  Diagnosis Date   Arthritis    Gout    Hypertension     Family History: Family History  Problem Relation Age of Onset   Hyperlipidemia Mother     Social History   Socioeconomic History   Marital  status: Single    Spouse name: Not on file   Number of children: Not on file   Years of education: Not on file   Highest education level: Not on file  Occupational History   Not on file  Tobacco Use   Smoking status: Former   Smokeless tobacco: Never  Substance and Sexual Activity   Alcohol use: Yes   Drug use: Yes    Types: Marijuana   Sexual activity: Not on file  Other Topics Concern   Not on file  Social History Narrative   Not on file   Social Determinants of Health   Financial Resource Strain: Not on file  Food Insecurity: Not on file  Transportation Needs: Not on file  Physical Activity: Not on file  Stress: Not on file  Social Connections: Not on file  Intimate Partner Violence: Not on file      Review of Systems  Constitutional:  Negative for activity change, appetite change, chills, fatigue, fever and unexpected weight change.  HENT: Negative.  Negative for congestion, ear pain, rhinorrhea, sore throat and trouble swallowing.   Eyes: Negative.   Respiratory: Negative.  Negative for cough, chest tightness, shortness of breath and wheezing.   Cardiovascular: Negative.  Negative for chest pain.  Gastrointestinal: Negative.  Negative for abdominal pain, blood in stool, constipation, diarrhea, nausea and vomiting.  Endocrine: Negative.   Genitourinary: Negative.  Negative for difficulty urinating, dysuria,  frequency, hematuria and urgency.  Musculoskeletal: Negative.  Negative for arthralgias, back pain, joint swelling, myalgias and neck pain.  Skin: Negative.  Negative for rash and wound.  Allergic/Immunologic: Negative.  Negative for immunocompromised state.  Neurological: Negative.  Negative for dizziness, seizures, numbness and headaches.  Hematological: Negative.   Psychiatric/Behavioral: Negative.  Negative for behavioral problems, self-injury and suicidal ideas. The patient is not nervous/anxious.    Vital Signs: BP (!) 150/88 Comment: 160/82   Pulse 75     Temp 98.6 F (37 C)    Resp 16    Ht 6' 2"  (1.88 m)    Wt 239 lb 12.8 oz (108.8 kg)    SpO2 98%    BMI 30.79 kg/m    Physical Exam Vitals reviewed.  Constitutional:      General: He is awake. He is not in acute distress.    Appearance: Normal appearance. He is well-developed and well-groomed. He is obese. He is not ill-appearing or diaphoretic.  HENT:     Head: Normocephalic and atraumatic.     Right Ear: Tympanic membrane, ear canal and external ear normal.     Left Ear: Tympanic membrane, ear canal and external ear normal.     Nose: Nose normal. No congestion or rhinorrhea.     Mouth/Throat:     Lips: Pink.     Mouth: Mucous membranes are moist.     Pharynx: Oropharynx is clear. Uvula midline. No oropharyngeal exudate or posterior oropharyngeal erythema.  Eyes:     General: Lids are normal. Vision grossly intact. Gaze aligned appropriately. No scleral icterus.       Right eye: No discharge.        Left eye: No discharge.     Extraocular Movements: Extraocular movements intact.     Conjunctiva/sclera: Conjunctivae normal.     Pupils: Pupils are equal, round, and reactive to light.     Funduscopic exam:    Right eye: Red reflex present.        Left eye: Red reflex present. Neck:     Thyroid: No thyromegaly.     Vascular: No JVD.     Trachea: Trachea and phonation normal. No tracheal deviation.  Cardiovascular:     Rate and Rhythm: Normal rate and regular rhythm.     Pulses: Normal pulses.     Heart sounds: Normal heart sounds, S1 normal and S2 normal. No murmur heard.   No friction rub. No gallop.  Pulmonary:     Effort: Pulmonary effort is normal. No accessory muscle usage or respiratory distress.     Breath sounds: Normal breath sounds and air entry. No stridor. No wheezing or rales.  Chest:     Chest wall: No tenderness.  Abdominal:     General: Bowel sounds are normal. There is no distension.     Palpations: Abdomen is soft. There is no shifting dullness, fluid  wave, mass or pulsatile mass.     Tenderness: There is no abdominal tenderness. There is no guarding or rebound.  Musculoskeletal:        General: No tenderness or deformity. Normal range of motion.     Cervical back: Normal range of motion and neck supple.     Right lower leg: No edema.     Left lower leg: No edema.  Lymphadenopathy:     Cervical: No cervical adenopathy.  Skin:    General: Skin is warm and dry.     Capillary Refill: Capillary refill takes less than  2 seconds.     Coloration: Skin is not pale.     Findings: No erythema or rash.  Neurological:     Mental Status: He is alert and oriented to person, place, and time.     Cranial Nerves: No cranial nerve deficit.     Motor: No abnormal muscle tone.     Coordination: Coordination normal.     Gait: Gait normal.     Deep Tendon Reflexes: Reflexes are normal and symmetric.  Psychiatric:        Mood and Affect: Mood normal.        Behavior: Behavior normal. Behavior is cooperative.        Thought Content: Thought content normal.        Judgment: Judgment normal.       Assessment/Plan: 1. Encounter for general adult medical examination with abnormal findings Age-appropriate preventive screenings and vaccinations discussed, annual physical exam completed. Routine labs for health maintenance previously ordered, patient will have drawn today. PHM updated.   2. Essential hypertension Blood pressure elevated today but patient took meds right before appt. BP is better at home.   3. Hypertriglyceridemia Taking fenofibrate and vascepa, will review lipid panel after it is drawn  4. Chronic gout without tophus, unspecified cause, unspecified site Taking allopurinol, currently stable.   5. Dysuria Routine urinalysis done - UA/M w/rflx Culture, Routine  6. Screening for colorectal cancer Cologard test ordered.  - Cologuard  7. Obesity (BMI 30.0-34.9) Patient aware that weight loss would be beneficial to his blood  pressure and cholesterol problems. Patient can call clinic if he needs counseling or assistance with weight loss management.  Obesity Counseling: Risk Assessment: An assessment of behavioral risk factors was made today and includes lack of exercise sedentary lifestyle, lack of portion control and poor dietary habits. The patient has been screened for diabetes and a metabolic test to determine basal metabolic rate has been performed.   Risk Modification Advice: The patient was counseled on portion control guidelines. Safe recommendations on caloric restriction discussed with patient. Sample meals plans were provided. General guidelines on diet and lifestyle modifications were discussed at length. Introducing physical activity as tolerated and at the patient's ability level is recommended.        General Counseling: Arvel verbalizes understanding of the findings of todays visit and agrees with plan of treatment. I have discussed any further diagnostic evaluation that may be needed or ordered today. We also reviewed his medications today. he has been encouraged to call the office with any questions or concerns that should arise related to todays visit.    Orders Placed This Encounter  Procedures   Cologuard   UA/M w/rflx Culture, Routine    No orders of the defined types were placed in this encounter.   Return in about 6 months (around 09/02/2021) for F/U, med refill, Rollande Thursby PCP.   Total time spent:30 Minutes Time spent includes review of chart, medications, test results, and follow up plan with the patient.   [place] Controlled Substance Database was reviewed by me.  This patient was seen by Jonetta Osgood, FNP-C in collaboration with Dr. Clayborn Bigness as a part of collaborative care agreement.  Lizzete Gough R. Valetta Fuller, MSN, FNP-C Internal medicine

## 2021-03-06 LAB — UA/M W/RFLX CULTURE, ROUTINE
Bilirubin, UA: NEGATIVE
Glucose, UA: NEGATIVE
Ketones, UA: NEGATIVE
Leukocytes,UA: NEGATIVE
Nitrite, UA: NEGATIVE
Protein,UA: NEGATIVE
RBC, UA: NEGATIVE
Specific Gravity, UA: 1.022 (ref 1.005–1.030)
Urobilinogen, Ur: 0.2 mg/dL (ref 0.2–1.0)
pH, UA: 5.5 (ref 5.0–7.5)

## 2021-03-06 LAB — CBC WITH DIFFERENTIAL/PLATELET
Basophils Absolute: 0.1 10*3/uL (ref 0.0–0.2)
Basos: 1 %
EOS (ABSOLUTE): 0.4 10*3/uL (ref 0.0–0.4)
Eos: 6 %
Hematocrit: 42.2 % (ref 37.5–51.0)
Hemoglobin: 14.7 g/dL (ref 13.0–17.7)
Immature Grans (Abs): 0 10*3/uL (ref 0.0–0.1)
Immature Granulocytes: 0 %
Lymphocytes Absolute: 1.6 10*3/uL (ref 0.7–3.1)
Lymphs: 23 %
MCH: 28.5 pg (ref 26.6–33.0)
MCHC: 34.8 g/dL (ref 31.5–35.7)
MCV: 82 fL (ref 79–97)
Monocytes Absolute: 0.5 10*3/uL (ref 0.1–0.9)
Monocytes: 7 %
Neutrophils Absolute: 4.3 10*3/uL (ref 1.4–7.0)
Neutrophils: 63 %
Platelets: 191 10*3/uL (ref 150–450)
RBC: 5.15 x10E6/uL (ref 4.14–5.80)
RDW: 11.6 % (ref 11.6–15.4)
WBC: 6.8 10*3/uL (ref 3.4–10.8)

## 2021-03-06 LAB — CMP14+EGFR
ALT: 35 IU/L (ref 0–44)
AST: 32 IU/L (ref 0–40)
Albumin/Globulin Ratio: 1.6 (ref 1.2–2.2)
Albumin: 4.7 g/dL (ref 4.0–5.0)
Alkaline Phosphatase: 73 IU/L (ref 44–121)
BUN/Creatinine Ratio: 17 (ref 9–20)
BUN: 21 mg/dL (ref 6–24)
Bilirubin Total: 0.4 mg/dL (ref 0.0–1.2)
CO2: 22 mmol/L (ref 20–29)
Calcium: 9.6 mg/dL (ref 8.7–10.2)
Chloride: 103 mmol/L (ref 96–106)
Creatinine, Ser: 1.22 mg/dL (ref 0.76–1.27)
Globulin, Total: 2.9 g/dL (ref 1.5–4.5)
Glucose: 102 mg/dL — ABNORMAL HIGH (ref 70–99)
Potassium: 4.8 mmol/L (ref 3.5–5.2)
Sodium: 140 mmol/L (ref 134–144)
Total Protein: 7.6 g/dL (ref 6.0–8.5)
eGFR: 74 mL/min/{1.73_m2} (ref >59)

## 2021-03-06 LAB — VITAMIN D 25 HYDROXY (VIT D DEFICIENCY, FRACTURES): Vit D, 25-Hydroxy: 14.7 ng/mL — ABNORMAL LOW (ref 30.0–100.0)

## 2021-03-06 LAB — LIPID PANEL
Chol/HDL Ratio: 4.7 ratio (ref 0.0–5.0)
Cholesterol, Total: 215 mg/dL — ABNORMAL HIGH (ref 100–199)
HDL: 46 mg/dL (ref >39)
LDL Chol Calc (NIH): 125 mg/dL — ABNORMAL HIGH (ref 0–99)
Triglycerides: 248 mg/dL — ABNORMAL HIGH (ref 0–149)
VLDL Cholesterol Cal: 44 mg/dL — ABNORMAL HIGH (ref 5–40)

## 2021-03-06 LAB — TSH+FREE T4
Free T4: 1.1 ng/dL (ref 0.82–1.77)
TSH: 1.66 u[IU]/mL (ref 0.450–4.500)

## 2021-03-06 LAB — MICROSCOPIC EXAMINATION
Bacteria, UA: NONE SEEN
Casts: NONE SEEN /lpf
Epithelial Cells (non renal): NONE SEEN /hpf (ref 0–10)
RBC, Urine: NONE SEEN /hpf (ref 0–2)
WBC, UA: NONE SEEN /hpf (ref 0–5)

## 2021-03-25 ENCOUNTER — Other Ambulatory Visit: Payer: Self-pay

## 2021-03-25 ENCOUNTER — Telehealth: Payer: Self-pay

## 2021-03-25 MED ORDER — ERGOCALCIFEROL 1.25 MG (50000 UT) PO CAPS: 50000.0000 [IU] | ORAL_CAPSULE | ORAL | 5 refills | Status: DC

## 2021-03-25 NOTE — Progress Notes (Signed)
Please call patient with results: -cholesterol is elevated but improving when compared to a year ago -metabolic panel is normal, kidney and liver function are normal -CBC is normal, no anemia. -thyroid levels are normal -vitamin D is significantly low, please send vitamin D 50,000 unit capsule, take 1 capsule by mouth once a week, will repeat vitamin D level in 3-6 months.

## 2021-03-25 NOTE — Telephone Encounter (Signed)
-----   Message from Jonetta Osgood, NP sent at 03/25/2021  1:43 PM EST ----- Please call patient with results: -cholesterol is elevated but improving when compared to a year ago -metabolic panel is normal, kidney and liver function are normal -CBC is normal, no anemia. -thyroid levels are normal -vitamin D is significantly low, please send vitamin D 50,000 unit capsule, take 1 capsule by mouth once a week, will repeat vitamin D level in 3-6 months.

## 2021-03-26 NOTE — Telephone Encounter (Signed)
Spoke to pt, provided results and informed him VitD was sent to pharmacy, pt has CPE in 6 months and would like to do blood work a few weeks prior to CPE if possible. I did tell him to call a few weeks before to make sure blood work has been ordered.

## 2021-04-20 DIAGNOSIS — M79672 Pain in left foot: Secondary | ICD-10-CM | POA: Diagnosis not present

## 2021-04-20 DIAGNOSIS — S92501A Displaced unspecified fracture of right lesser toe(s), initial encounter for closed fracture: Secondary | ICD-10-CM | POA: Diagnosis not present

## 2021-05-19 DIAGNOSIS — Z1211 Encounter for screening for malignant neoplasm of colon: Secondary | ICD-10-CM | POA: Diagnosis not present

## 2021-05-19 DIAGNOSIS — Z1212 Encounter for screening for malignant neoplasm of rectum: Secondary | ICD-10-CM | POA: Diagnosis not present

## 2021-05-29 LAB — COLOGUARD: COLOGUARD: NEGATIVE

## 2021-06-09 ENCOUNTER — Other Ambulatory Visit: Payer: Self-pay | Admitting: Nurse Practitioner

## 2021-06-09 DIAGNOSIS — E782 Mixed hyperlipidemia: Secondary | ICD-10-CM

## 2021-06-09 DIAGNOSIS — I10 Essential (primary) hypertension: Secondary | ICD-10-CM

## 2021-06-09 DIAGNOSIS — M1A9XX0 Chronic gout, unspecified, without tophus (tophi): Secondary | ICD-10-CM

## 2021-06-10 ENCOUNTER — Telehealth: Payer: Self-pay

## 2021-06-10 NOTE — Telephone Encounter (Signed)
-----   Message from Jonetta Osgood, NP sent at 06/09/2021 10:27 PM EDT ----- ?Please let patient know that his Cologuard result was negative.  Recommend repeat Cologuard test in 3 years. ?

## 2021-06-10 NOTE — Telephone Encounter (Signed)
LMOM, pt needs to know cologuard is negative and to repeat cologuard in 3 years ?

## 2021-06-11 ENCOUNTER — Telehealth: Payer: Self-pay

## 2021-06-11 NOTE — Telephone Encounter (Signed)
-----   Message from Jonetta Osgood, NP sent at 06/09/2021 10:27 PM EDT ----- ?Please let patient know that his Cologuard result was negative.  Recommend repeat Cologuard test in 3 years. ?

## 2021-06-11 NOTE — Telephone Encounter (Signed)
LMOM cologuard was neg. Pt can repeat in 3 years. ?

## 2021-08-27 ENCOUNTER — Ambulatory Visit: Payer: BC Managed Care – PPO | Admitting: Nurse Practitioner

## 2021-08-28 ENCOUNTER — Ambulatory Visit: Payer: BC Managed Care – PPO | Admitting: Nurse Practitioner

## 2021-09-25 ENCOUNTER — Encounter: Payer: Self-pay | Admitting: Nurse Practitioner

## 2021-09-25 ENCOUNTER — Ambulatory Visit: Payer: BC Managed Care – PPO | Admitting: Nurse Practitioner

## 2021-09-25 VITALS — BP 150/85 | HR 69 | Temp 98.9°F | Resp 16 | Ht 74.0 in | Wt 245.8 lb

## 2021-09-25 DIAGNOSIS — Z23 Encounter for immunization: Secondary | ICD-10-CM

## 2021-09-25 DIAGNOSIS — M1A9XX0 Chronic gout, unspecified, without tophus (tophi): Secondary | ICD-10-CM

## 2021-09-25 DIAGNOSIS — Z760 Encounter for issue of repeat prescription: Secondary | ICD-10-CM | POA: Diagnosis not present

## 2021-09-25 DIAGNOSIS — E782 Mixed hyperlipidemia: Secondary | ICD-10-CM

## 2021-09-25 DIAGNOSIS — I10 Essential (primary) hypertension: Secondary | ICD-10-CM | POA: Diagnosis not present

## 2021-09-25 MED ORDER — AMLODIPINE BESYLATE 10 MG PO TABS: 10.0000 mg | ORAL_TABLET | Freq: Every day | ORAL | 1 refills | Status: DC

## 2021-09-25 MED ORDER — TETANUS-DIPHTH-ACELL PERTUSSIS 5-2.5-18.5 LF-MCG/0.5 IM SUSP: 0.5000 mL | Freq: Once | INTRAMUSCULAR | 0 refills | Status: AC

## 2021-09-25 MED ORDER — ATENOLOL 50 MG PO TABS: 50.0000 mg | ORAL_TABLET | Freq: Every day | ORAL | 1 refills | Status: DC

## 2021-09-25 MED ORDER — ALLOPURINOL 100 MG PO TABS: 100.0000 mg | ORAL_TABLET | Freq: Every day | ORAL | 1 refills | Status: DC

## 2021-09-25 MED ORDER — FENOFIBRATE 48 MG PO TABS: 48.0000 mg | ORAL_TABLET | Freq: Every day | ORAL | 1 refills | Status: DC

## 2021-09-25 MED ORDER — ICOSAPENT ETHYL 0.5 G PO CAPS: 4.0000 | ORAL_CAPSULE | Freq: Two times a day (BID) | ORAL | 3 refills | Status: DC

## 2021-09-25 NOTE — Progress Notes (Signed)
[facility] [address] [place], [place] [address]  Internal MEDICINE  Office Visit Note  Patient Name: Anthony Rios  [id number]  [id number]  Date of Service: 09/25/2021  Chief Complaint  Patient presents with   Follow-up   Hypertension   Medication Refill   Quality Metric Gaps    tdap    HPI Anthony Rios presents for a follow up visit for hypertension, hyperlipidemia, and gout Hypertension -- BP elevated this morning but he confirms that he woke up late and just took his BP meds. Typically BP meds do control his blood pressure, reports normal BP at home.  Hyperlipidemia -- last lipid panel was in January and continues to improve. Taking vascepa 2g twice daily and fenofibrate 48 mg daily.  Gout arthritis -- takes allopurinol for prevention Due for Tdap vaccine     Current Medication: Outpatient Encounter Medications as of 09/25/2021  Medication Sig   ergocalciferol (DRISDOL) 1.25 MG (50000 UT) capsule Take 1 capsule (50,000 Units total) by mouth once a week.   [DISCONTINUED] allopurinol (ZYLOPRIM) 100 MG tablet TAKE 1 TABLET BY MOUTH EVERY DAY   [DISCONTINUED] amLODipine (NORVASC) 10 MG tablet TAKE 1 TABLET BY MOUTH EVERY DAY   [DISCONTINUED] atenolol (TENORMIN) 50 MG tablet TAKE 1 TABLET BY MOUTH EVERY DAY   [DISCONTINUED] fenofibrate (TRICOR) 48 MG tablet TAKE 1 TABLET BY MOUTH EVERY DAY   [DISCONTINUED] Icosapent Ethyl (VASCEPA) 0.5 g CAPS Take 4 capsules by mouth 2 (two) times daily.   allopurinol (ZYLOPRIM) 100 MG tablet Take 1 tablet (100 mg total) by mouth daily.   amLODipine (NORVASC) 10 MG tablet Take 1 tablet (10 mg total) by mouth daily.   atenolol (TENORMIN) 50 MG tablet Take 1 tablet (50 mg total) by mouth daily.   fenofibrate (TRICOR) 48 MG tablet Take 1 tablet (48 mg total) by mouth daily.   Icosapent Ethyl (VASCEPA) 0.5 g CAPS Take 4 capsules by mouth 2 (two) times daily.   [EXPIRED] Tdap (BOOSTRIX) 5-2.5-18.5 LF-MCG/0.5 injection Inject 0.5 mLs  into the muscle once for 1 dose.   [DISCONTINUED] Tdap (BOOSTRIX) 5-2.5-18.5 LF-MCG/0.5 injection Inject 0.5 mLs into the muscle once.   No facility-administered encounter medications on file as of 09/25/2021.    Surgical History: History reviewed. No pertinent surgical history.  Medical History: Past Medical History:  Diagnosis Date   Arthritis    Gout    Hypertension     Family History: Family History  Problem Relation Age of Onset   Hyperlipidemia Mother     Social History   Socioeconomic History   Marital status: Single    Spouse name: Not on file   Number of children: Not on file   Years of education: Not on file   Highest education level: Not on file  Occupational History   Not on file  Tobacco Use   Smoking status: Former   Smokeless tobacco: Never  Substance and Sexual Activity   Alcohol use: Yes   Drug use: Yes    Types: Marijuana   Sexual activity: Not on file  Other Topics Concern   Not on file  Social History Narrative   Not on file   Social Determinants of Health   Financial Resource Strain: Not on file  Food Insecurity: Not on file  Transportation Needs: Not on file  Physical Activity: Not on file  Stress: Not on file  Social Connections: Not on file  Intimate Partner Violence: Not on file      Review of Systems  Constitutional:  Negative for chills, fatigue and unexpected weight change.  HENT:  Negative for congestion, rhinorrhea, sneezing and sore throat.   Eyes:  Negative for redness.  Respiratory:  Negative for cough, chest tightness and shortness of breath.   Cardiovascular:  Negative for chest pain and palpitations.  Gastrointestinal:  Negative for abdominal pain, constipation, diarrhea, nausea and vomiting.  Genitourinary:  Negative for dysuria and frequency.  Musculoskeletal:  Negative for arthralgias, back pain, joint swelling and neck pain.  Skin:  Negative for rash.  Neurological: Negative.  Negative for tremors and numbness.   Hematological:  Negative for adenopathy. Does not bruise/bleed easily.  Psychiatric/Behavioral:  Negative for behavioral problems (Depression), sleep disturbance and suicidal ideas. The patient is not nervous/anxious.     Vital Signs: BP (!) 150/85 Comment: 170/95  Pulse 69   Temp 98.9 F (37.2 C)   Resp 16   Ht 6\' 2"  (1.88 m)   Wt 245 lb 12.8 oz (111.5 kg)   SpO2 99%   BMI 31.56 kg/m    Physical Exam Vitals reviewed.  Constitutional:      General: He is not in acute distress.    Appearance: Normal appearance. He is obese. He is not ill-appearing.  HENT:     Head: Normocephalic and atraumatic.  Eyes:     Extraocular Movements: Extraocular movements intact.     Pupils: Pupils are equal, round, and reactive to light.  Cardiovascular:     Rate and Rhythm: Normal rate and regular rhythm.  Pulmonary:     Effort: Pulmonary effort is normal. No respiratory distress.  Neurological:     Mental Status: He is alert and oriented to person, place, and time.     Cranial Nerves: No cranial nerve deficit.     Coordination: Coordination normal.     Gait: Gait normal.  Psychiatric:        Mood and Affect: Mood normal.        Behavior: Behavior normal.        Assessment/Plan: 1. Essential hypertension Stable, continue current medications as prescribed.  - amLODipine (NORVASC) 10 MG tablet; Take 1 tablet (10 mg total) by mouth daily.  Dispense: 90 tablet; Refill: 1 - atenolol (TENORMIN) 50 MG tablet; Take 1 tablet (50 mg total) by mouth daily.  Dispense: 90 tablet; Refill: 1  2. Medication refill - allopurinol (ZYLOPRIM) 100 MG tablet; Take 1 tablet (100 mg total) by mouth daily.  Dispense: 90 tablet; Refill: 1 - fenofibrate (TRICOR) 48 MG tablet; Take 1 tablet (48 mg total) by mouth daily.  Dispense: 90 tablet; Refill: 1 - Icosapent Ethyl (VASCEPA) 0.5 g CAPS; Take 4 capsules by mouth 2 (two) times daily.  Dispense: 240 capsule; Refill: 3  3. Need for Tdap vaccination - Tdap  (BOOSTRIX) 5-2.5-18.5 LF-MCG/0.5 injection; Inject 0.5 mLs into the muscle once for 1 dose.  Dispense: 0.5 mL; Refill: 0  4. Mixed hyperlipidemia Will repeat lipid panel in January. Continue vascepa and fenofibrate as prescribed.    General Counseling: Anthony Rios verbalizes understanding of the findings of todays visit and agrees with plan of treatment. I have discussed any further diagnostic evaluation that may be needed or ordered today. We also reviewed his medications today. he has been encouraged to call the office with any questions or concerns that should arise related to todays visit.    No orders of the defined types were placed in this encounter.   Meds ordered this encounter  Medications   allopurinol (ZYLOPRIM) 100 MG  tablet    Sig: Take 1 tablet (100 mg total) by mouth daily.    Dispense:  90 tablet    Refill:  1   amLODipine (NORVASC) 10 MG tablet    Sig: Take 1 tablet (10 mg total) by mouth daily.    Dispense:  90 tablet    Refill:  1   atenolol (TENORMIN) 50 MG tablet    Sig: Take 1 tablet (50 mg total) by mouth daily.    Dispense:  90 tablet    Refill:  1   fenofibrate (TRICOR) 48 MG tablet    Sig: Take 1 tablet (48 mg total) by mouth daily.    Dispense:  90 tablet    Refill:  1   Icosapent Ethyl (VASCEPA) 0.5 g CAPS    Sig: Take 4 capsules by mouth 2 (two) times daily.    Dispense:  240 capsule    Refill:  3   Tdap (BOOSTRIX) 5-2.5-18.5 LF-MCG/0.5 injection    Sig: Inject 0.5 mLs into the muscle once for 1 dose.    Dispense:  0.5 mL    Refill:  0    Return for previously scheduled, CPE, Andriana Casa PCP in january 2024. Marland Kitchen   Total time spent:30 Minutes Time spent includes review of chart, medications, test results, and follow up plan with the patient.   [place] Controlled Substance Database was reviewed by me.  This patient was seen by Sallyanne Kuster, FNP-C in collaboration with Dr. Beverely Risen as a part of collaborative care agreement.   Julio Storr R. Tedd Sias,  MSN, FNP-C Internal medicine

## 2021-11-08 ENCOUNTER — Encounter: Payer: Self-pay | Admitting: Nurse Practitioner

## 2022-02-25 ENCOUNTER — Encounter: Payer: BC Managed Care – PPO | Admitting: Nurse Practitioner

## 2022-03-06 ENCOUNTER — Encounter: Payer: Self-pay | Admitting: Nurse Practitioner

## 2022-03-06 ENCOUNTER — Ambulatory Visit (INDEPENDENT_AMBULATORY_CARE_PROVIDER_SITE_OTHER): Payer: BC Managed Care – PPO | Admitting: Nurse Practitioner

## 2022-03-06 VITALS — BP 154/89 | HR 77 | Temp 98.5°F | Resp 16 | Ht 74.0 in | Wt 245.2 lb

## 2022-03-06 DIAGNOSIS — Z0001 Encounter for general adult medical examination with abnormal findings: Secondary | ICD-10-CM

## 2022-03-06 DIAGNOSIS — E782 Mixed hyperlipidemia: Secondary | ICD-10-CM | POA: Diagnosis not present

## 2022-03-06 DIAGNOSIS — E559 Vitamin D deficiency, unspecified: Secondary | ICD-10-CM | POA: Diagnosis not present

## 2022-03-06 DIAGNOSIS — R300 Dysuria: Secondary | ICD-10-CM

## 2022-03-06 DIAGNOSIS — I10 Essential (primary) hypertension: Secondary | ICD-10-CM | POA: Diagnosis not present

## 2022-03-06 DIAGNOSIS — Z23 Encounter for immunization: Secondary | ICD-10-CM

## 2022-03-06 DIAGNOSIS — Z760 Encounter for issue of repeat prescription: Secondary | ICD-10-CM

## 2022-03-06 MED ORDER — AMLODIPINE BESYLATE 10 MG PO TABS: 10.0000 mg | ORAL_TABLET | Freq: Every day | ORAL | 1 refills | Status: DC

## 2022-03-06 MED ORDER — TETANUS-DIPHTH-ACELL PERTUSSIS 5-2.5-18.5 LF-MCG/0.5 IM SUSP: 0.5000 mL | Freq: Once | INTRAMUSCULAR | 0 refills | Status: AC

## 2022-03-06 MED ORDER — ALLOPURINOL 100 MG PO TABS: 100.0000 mg | ORAL_TABLET | Freq: Every day | ORAL | 1 refills | Status: DC

## 2022-03-06 MED ORDER — FENOFIBRATE 48 MG PO TABS: 48.0000 mg | ORAL_TABLET | Freq: Every day | ORAL | 1 refills | Status: DC

## 2022-03-06 MED ORDER — ATENOLOL 50 MG PO TABS: 50.0000 mg | ORAL_TABLET | Freq: Every day | ORAL | 1 refills | Status: DC

## 2022-03-06 NOTE — Progress Notes (Signed)
[facility] [place],  [address]  Internal MEDICINE  Office Visit Note  Patient Name: Anthony Rios  [id number]  [id number]  Date of Service: 03/06/2022  Chief Complaint  Patient presents with   Annual Exam   Hypertension    HPI Remigio presents for an annual well visit and physical exam.  Well-appearing 47 y.o. male with hypertension, hyperlipidemia and gout  Routine CRC screening: negative cologuard in march 2023, due in 2026 Labs: due for routine labs  New or worsening pain: none Other concerns: none Taking nexium for acid reflux -- ok to take OTC.       03/06/2022    9:58 AM  Depression screen PHQ 2/9  Decreased Interest 0  Down, Depressed, Hopeless 0  PHQ - 2 Score 0      Current Medication: Outpatient Encounter Medications as of 03/06/2022  Medication Sig   allopurinol (ZYLOPRIM) 100 MG tablet Take 1 tablet (100 mg total) by mouth daily.   amLODipine (NORVASC) 10 MG tablet Take 1 tablet (10 mg total) by mouth daily.   atenolol (TENORMIN) 50 MG tablet Take 1 tablet (50 mg total) by mouth daily.   ergocalciferol (DRISDOL) 1.25 MG (50000 UT) capsule Take 1 capsule (50,000 Units total) by mouth once a week.   fenofibrate (TRICOR) 48 MG tablet Take 1 tablet (48 mg total) by mouth daily.   Icosapent Ethyl (VASCEPA) 0.5 g CAPS Take 4 capsules by mouth 2 (two) times daily.   [DISCONTINUED] Tdap (BOOSTRIX) 5-2.5-18.5 LF-MCG/0.5 injection Inject 0.5 mLs into the muscle once.   Tdap (BOOSTRIX) 5-2.5-18.5 LF-MCG/0.5 injection Inject 0.5 mLs into the muscle once for 1 dose.   No facility-administered encounter medications on file as of 03/06/2022.    Surgical History: History reviewed. No pertinent surgical history.  Medical History: Past Medical History:  Diagnosis Date   Arthritis    Gout    Hypertension     Family History: Family History  Problem Relation Age of Onset   Hyperlipidemia Mother     Social History    Socioeconomic History   Marital status: Single    Spouse name: Not on file   Number of children: Not on file   Years of education: Not on file   Highest education level: Not on file  Occupational History   Not on file  Tobacco Use   Smoking status: Former   Smokeless tobacco: Never  Substance and Sexual Activity   Alcohol use: Yes   Drug use: Yes    Types: Marijuana   Sexual activity: Not on file  Other Topics Concern   Not on file  Social History Narrative   Not on file   Social Determinants of Health   Financial Resource Strain: Not on file  Food Insecurity: Not on file  Transportation Needs: Not on file  Physical Activity: Not on file  Stress: Not on file  Social Connections: Not on file  Intimate Partner Violence: Not on file      Review of Systems  Constitutional:  Negative for activity change, appetite change, chills, fatigue, fever and unexpected weight change.  HENT: Negative.  Negative for congestion, ear pain, rhinorrhea, sore throat and trouble swallowing.   Eyes: Negative.   Respiratory: Negative.  Negative for cough, chest tightness, shortness of breath and wheezing.   Cardiovascular: Negative.  Negative for chest pain.  Gastrointestinal: Negative.  Negative for abdominal pain, blood in stool, constipation, diarrhea, nausea and vomiting.  Endocrine: Negative.   Genitourinary: Negative.  Negative for difficulty urinating, dysuria, frequency, hematuria and urgency.  Musculoskeletal: Negative.  Negative for arthralgias, back pain, joint swelling, myalgias and neck pain.  Skin: Negative.  Negative for rash and wound.  Allergic/Immunologic: Negative.  Negative for immunocompromised state.  Neurological: Negative.  Negative for dizziness, seizures, numbness and headaches.  Hematological: Negative.   Psychiatric/Behavioral: Negative.  Negative for behavioral problems, self-injury and suicidal ideas. The patient is not nervous/anxious.     Vital Signs: BP  (!) 154/89   Pulse 77   Temp 98.5 F (36.9 C)   Resp 16   Ht 6\' 2"  (1.88 m)   Wt 245 lb 3.2 oz (111.2 kg)   SpO2 97%   BMI 31.48 kg/m    Physical Exam Vitals reviewed.  Constitutional:      General: He is awake. He is not in acute distress.    Appearance: Normal appearance. He is well-developed and well-groomed. He is obese. He is not ill-appearing or diaphoretic.  HENT:     Head: Normocephalic and atraumatic.     Right Ear: Tympanic membrane, ear canal and external ear normal.     Left Ear: Tympanic membrane, ear canal and external ear normal.     Nose: Nose normal. No congestion or rhinorrhea.     Mouth/Throat:     Lips: Pink.     Mouth: Mucous membranes are moist.     Pharynx: Oropharynx is clear. Uvula midline. No oropharyngeal exudate or posterior oropharyngeal erythema.  Eyes:     General: Lids are normal. Vision grossly intact. Gaze aligned appropriately. No scleral icterus.       Right eye: No discharge.        Left eye: No discharge.     Extraocular Movements: Extraocular movements intact.     Conjunctiva/sclera: Conjunctivae normal.     Pupils: Pupils are equal, round, and reactive to light.     Funduscopic exam:    Right eye: Red reflex present.        Left eye: Red reflex present. Neck:     Thyroid: No thyromegaly.     Vascular: No JVD.     Trachea: Trachea and phonation normal. No tracheal deviation.  Cardiovascular:     Rate and Rhythm: Normal rate and regular rhythm.     Pulses: Normal pulses.     Heart sounds: Normal heart sounds, S1 normal and S2 normal. No murmur heard.    No friction rub. No gallop.  Pulmonary:     Effort: Pulmonary effort is normal. No accessory muscle usage or respiratory distress.     Breath sounds: Normal breath sounds and air entry. No stridor. No wheezing or rales.  Chest:     Chest wall: No tenderness.  Abdominal:     General: Bowel sounds are normal. There is no distension.     Palpations: Abdomen is soft. There is no  shifting dullness, fluid wave, mass or pulsatile mass.     Tenderness: There is no abdominal tenderness. There is no guarding or rebound.  Musculoskeletal:        General: No tenderness or deformity. Normal range of motion.     Cervical back: Normal range of motion and neck supple.     Right lower leg: No edema.     Left lower leg: No edema.  Lymphadenopathy:     Cervical: No cervical adenopathy.  Skin:    General: Skin is warm and dry.     Capillary Refill: Capillary refill takes less than 2  seconds.     Coloration: Skin is not pale.     Findings: No erythema or rash.  Neurological:     Mental Status: He is alert and oriented to person, place, and time.     Cranial Nerves: No cranial nerve deficit.     Motor: No abnormal muscle tone.     Coordination: Coordination normal.     Gait: Gait normal.     Deep Tendon Reflexes: Reflexes are normal and symmetric.  Psychiatric:        Mood and Affect: Mood normal.        Behavior: Behavior normal. Behavior is cooperative.        Thought Content: Thought content normal.        Judgment: Judgment normal.        Assessment/Plan: 1. Encounter for general adult medical examination with abnormal findings Age-appropriate preventive screenings and vaccinations discussed, annual physical exam completed. Routine labs for health maintenance ordered, see below. PHM updated.  - CBC with Differential/Platelet - CMP14+EGFR - Lipid Profile - Vitamin D (25 hydroxy)  2. Essential hypertension Routine labs ordered - CBC with Differential/Platelet - CMP14+EGFR  3. Mixed hyperlipidemia Routine labs ordered - CMP14+EGFR - Lipid Profile  4. Vitamin D deficiency Routine lab ordered - Vitamin D (25 hydroxy)  5. Need for vaccination - Tdap ([place]) 5-2.5-18.5 LF-MCG/0.5 injection; Inject 0.5 mLs into the muscle once for 1 dose.  Dispense: 0.5 mL; Refill: 0     General Counseling: Tae verbalizes understanding of the findings of todays  visit and agrees with plan of treatment. I have discussed any further diagnostic evaluation that may be needed or ordered today. We also reviewed his medications today. he has been encouraged to call the office with any questions or concerns that should arise related to todays visit.    Orders Placed This Encounter  Procedures   CBC with Differential/Platelet   CMP14+EGFR   Lipid Profile   Vitamin D (25 hydroxy)    Meds ordered this encounter  Medications   Tdap (BOOSTRIX) 5-2.5-18.5 LF-MCG/0.5 injection    Sig: Inject 0.5 mLs into the muscle once for 1 dose.    Dispense:  0.5 mL    Refill:  0    Return in about 6 months (around 09/04/2022) for F/U, Abiel Antrim PCP, med refill.   Total time spent:30 Minutes Time spent includes review of chart, medications, test results, and follow up plan with the patient.   [place] Controlled Substance Database was reviewed by me.  This patient was seen by Jonetta Osgood, FNP-C in collaboration with Dr. Clayborn Bigness as a part of collaborative care agreement.  Butch Otterson R. Valetta Fuller, MSN, FNP-C Internal medicine

## 2022-03-07 LAB — UA/M W/RFLX CULTURE, ROUTINE
Bilirubin, UA: NEGATIVE
Glucose, UA: NEGATIVE
Ketones, UA: NEGATIVE
Leukocytes,UA: NEGATIVE
Nitrite, UA: NEGATIVE
Protein,UA: NEGATIVE
RBC, UA: NEGATIVE
Specific Gravity, UA: 1.024 (ref 1.005–1.030)
Urobilinogen, Ur: 0.2 mg/dL (ref 0.2–1.0)
pH, UA: 5.5 (ref 5.0–7.5)

## 2022-03-07 LAB — MICROSCOPIC EXAMINATION
Bacteria, UA: NONE SEEN
Casts: NONE SEEN /lpf
Epithelial Cells (non renal): NONE SEEN /hpf (ref 0–10)
RBC, Urine: NONE SEEN /hpf (ref 0–2)
WBC, UA: NONE SEEN /hpf (ref 0–5)

## 2022-08-31 DIAGNOSIS — E782 Mixed hyperlipidemia: Secondary | ICD-10-CM | POA: Diagnosis not present

## 2022-08-31 DIAGNOSIS — Z0001 Encounter for general adult medical examination with abnormal findings: Secondary | ICD-10-CM | POA: Diagnosis not present

## 2022-08-31 DIAGNOSIS — I10 Essential (primary) hypertension: Secondary | ICD-10-CM | POA: Diagnosis not present

## 2022-08-31 DIAGNOSIS — E559 Vitamin D deficiency, unspecified: Secondary | ICD-10-CM | POA: Diagnosis not present

## 2022-09-01 LAB — CBC WITH DIFFERENTIAL/PLATELET
Basophils Absolute: 0.1 10*3/uL (ref 0.0–0.2)
Basos: 1 %
EOS (ABSOLUTE): 0.4 10*3/uL (ref 0.0–0.4)
Eos: 6 %
Hematocrit: 43.5 % (ref 37.5–51.0)
Hemoglobin: 14.7 g/dL (ref 13.0–17.7)
Immature Grans (Abs): 0 10*3/uL (ref 0.0–0.1)
Immature Granulocytes: 0 %
Lymphocytes Absolute: 2.2 10*3/uL (ref 0.7–3.1)
Lymphs: 31 %
MCH: 28.1 pg (ref 26.6–33.0)
MCHC: 33.8 g/dL (ref 31.5–35.7)
MCV: 83 fL (ref 79–97)
Monocytes Absolute: 0.6 10*3/uL (ref 0.1–0.9)
Monocytes: 8 %
Neutrophils Absolute: 3.8 10*3/uL (ref 1.4–7.0)
Neutrophils: 54 %
Platelets: 203 10*3/uL (ref 150–450)
RBC: 5.23 x10E6/uL (ref 4.14–5.80)
RDW: 11.6 % (ref 11.6–15.4)
WBC: 7.1 10*3/uL (ref 3.4–10.8)

## 2022-09-01 LAB — CMP14+EGFR
ALT: 31 IU/L (ref 0–44)
AST: 27 IU/L (ref 0–40)
Albumin: 4.7 g/dL (ref 4.1–5.1)
Alkaline Phosphatase: 76 IU/L (ref 44–121)
BUN/Creatinine Ratio: 16 (ref 9–20)
BUN: 18 mg/dL (ref 6–24)
Bilirubin Total: 0.3 mg/dL (ref 0.0–1.2)
CO2: 23 mmol/L (ref 20–29)
Calcium: 9.9 mg/dL (ref 8.7–10.2)
Chloride: 102 mmol/L (ref 96–106)
Creatinine, Ser: 1.15 mg/dL (ref 0.76–1.27)
Globulin, Total: 2.7 g/dL (ref 1.5–4.5)
Glucose: 102 mg/dL — ABNORMAL HIGH (ref 70–99)
Potassium: 5.1 mmol/L (ref 3.5–5.2)
Sodium: 140 mmol/L (ref 134–144)
Total Protein: 7.4 g/dL (ref 6.0–8.5)
eGFR: 79 mL/min/{1.73_m2} (ref >59)

## 2022-09-01 LAB — LIPID PANEL
Chol/HDL Ratio: 4.7 ratio (ref 0.0–5.0)
Cholesterol, Total: 213 mg/dL — ABNORMAL HIGH (ref 100–199)
HDL: 45 mg/dL (ref >39)
LDL Chol Calc (NIH): 133 mg/dL — ABNORMAL HIGH (ref 0–99)
Triglycerides: 195 mg/dL — ABNORMAL HIGH (ref 0–149)
VLDL Cholesterol Cal: 35 mg/dL (ref 5–40)

## 2022-09-01 LAB — VITAMIN D 25 HYDROXY (VIT D DEFICIENCY, FRACTURES): Vit D, 25-Hydroxy: 32.4 ng/mL (ref 30.0–100.0)

## 2022-09-02 ENCOUNTER — Encounter: Payer: Self-pay | Admitting: Nurse Practitioner

## 2022-09-02 ENCOUNTER — Ambulatory Visit: Payer: BC Managed Care – PPO | Admitting: Nurse Practitioner

## 2022-09-02 VITALS — BP 135/80 | HR 74 | Temp 98.5°F | Resp 16 | Ht 74.0 in | Wt 240.2 lb

## 2022-09-02 DIAGNOSIS — E782 Mixed hyperlipidemia: Secondary | ICD-10-CM | POA: Diagnosis not present

## 2022-09-02 DIAGNOSIS — I10 Essential (primary) hypertension: Secondary | ICD-10-CM

## 2022-09-02 DIAGNOSIS — E559 Vitamin D deficiency, unspecified: Secondary | ICD-10-CM | POA: Diagnosis not present

## 2022-09-02 DIAGNOSIS — M1A9XX0 Chronic gout, unspecified, without tophus (tophi): Secondary | ICD-10-CM

## 2022-09-02 MED ORDER — ALLOPURINOL 100 MG PO TABS: 100.0000 mg | ORAL_TABLET | Freq: Every day | ORAL | 1 refills | Status: DC

## 2022-09-02 MED ORDER — ERGOCALCIFEROL 1.25 MG (50000 UT) PO CAPS: 50000.0000 [IU] | ORAL_CAPSULE | ORAL | 5 refills | Status: DC

## 2022-09-02 MED ORDER — ICOSAPENT ETHYL 0.5 G PO CAPS: 4.0000 | ORAL_CAPSULE | Freq: Two times a day (BID) | ORAL | 3 refills | Status: DC

## 2022-09-02 MED ORDER — ATENOLOL 50 MG PO TABS: 50.0000 mg | ORAL_TABLET | Freq: Every day | ORAL | 1 refills | Status: DC

## 2022-09-02 MED ORDER — AMLODIPINE BESYLATE 10 MG PO TABS: 10.0000 mg | ORAL_TABLET | Freq: Every day | ORAL | 1 refills | Status: DC

## 2022-09-02 MED ORDER — FENOFIBRATE 48 MG PO TABS: 48.0000 mg | ORAL_TABLET | Freq: Every day | ORAL | 1 refills | Status: DC

## 2022-09-02 NOTE — Progress Notes (Signed)
[facility] [address] [place], [place] [address]  Internal MEDICINE  Office Visit Note  Patient Name: Anthony Rios  [id number]  [id number]  Date of Service: 09/02/2022  Chief Complaint  Patient presents with   Follow-up    Med refill   Hypertension    HPI Ji presents for a follow-up visit for high cholesterol, hypertension and gout.  Hyperlipidemia -- LDL 133, triglycerides of 295, VLDL improved, HDL normal Hypertension -- slightly elevated, rechecked, improved. Taking amlodipine and atenolol daily.  Gout -- no recent episodes per patient, currently on allopurinol as preventive.     Current Medication: Outpatient Encounter Medications as of 09/02/2022  Medication Sig   [DISCONTINUED] allopurinol (ZYLOPRIM) 100 MG tablet Take 1 tablet (100 mg total) by mouth daily.   [DISCONTINUED] amLODipine (NORVASC) 10 MG tablet Take 1 tablet (10 mg total) by mouth daily.   [DISCONTINUED] atenolol (TENORMIN) 50 MG tablet Take 1 tablet (50 mg total) by mouth daily.   [DISCONTINUED] ergocalciferol (DRISDOL) 1.25 MG (50000 UT) capsule Take 1 capsule (50,000 Units total) by mouth once a week.   [DISCONTINUED] fenofibrate (TRICOR) 48 MG tablet Take 1 tablet (48 mg total) by mouth daily.   [DISCONTINUED] Icosapent Ethyl (VASCEPA) 0.5 g CAPS Take 4 capsules by mouth 2 (two) times daily.   allopurinol (ZYLOPRIM) 100 MG tablet Take 1 tablet (100 mg total) by mouth daily.   amLODipine (NORVASC) 10 MG tablet Take 1 tablet (10 mg total) by mouth daily.   atenolol (TENORMIN) 50 MG tablet Take 1 tablet (50 mg total) by mouth daily.   ergocalciferol (DRISDOL) 1.25 MG (50000 UT) capsule Take 1 capsule (50,000 Units total) by mouth once a week.   fenofibrate (TRICOR) 48 MG tablet Take 1 tablet (48 mg total) by mouth daily.   Icosapent Ethyl (VASCEPA) 0.5 g CAPS Take 4 capsules (2 g total) by mouth 2 (two) times daily.   No facility-administered encounter medications on file as of  09/02/2022.    Surgical History: History reviewed. No pertinent surgical history.  Medical History: Past Medical History:  Diagnosis Date   Arthritis    Gout    Hypertension     Family History: Family History  Problem Relation Age of Onset   Hyperlipidemia Mother     Social History   Socioeconomic History   Marital status: Single    Spouse name: Not on file   Number of children: Not on file   Years of education: Not on file   Highest education level: Not on file  Occupational History   Not on file  Tobacco Use   Smoking status: Former   Smokeless tobacco: Never  Substance and Sexual Activity   Alcohol use: Yes   Drug use: Yes    Types: Marijuana   Sexual activity: Not on file  Other Topics Concern   Not on file  Social History Narrative   Not on file   Social Determinants of Health   Financial Resource Strain: Not on file  Food Insecurity: Not on file  Transportation Needs: Not on file  Physical Activity: Not on file  Stress: Not on file  Social Connections: Not on file  Intimate Partner Violence: Not on file      Review of Systems  Constitutional:  Negative for chills, fatigue and unexpected weight change.  HENT:  Negative for congestion, rhinorrhea, sneezing and sore throat.   Eyes:  Negative for redness.  Respiratory:  Negative for cough, chest tightness and shortness of breath.  Cardiovascular:  Negative for chest pain and palpitations.  Gastrointestinal:  Negative for abdominal pain, constipation, diarrhea, nausea and vomiting.  Genitourinary:  Negative for dysuria and frequency.  Musculoskeletal:  Negative for arthralgias, back pain, joint swelling and neck pain.  Skin:  Negative for rash.  Neurological: Negative.  Negative for tremors and numbness.  Hematological:  Negative for adenopathy. Does not bruise/bleed easily.  Psychiatric/Behavioral:  Negative for behavioral problems (Depression), sleep disturbance and suicidal ideas. The patient is  not nervous/anxious.     Vital Signs: BP 135/80 Comment: 158/88  Pulse 74   Temp 98.5 F (36.9 C)   Resp 16   Ht 6\' 2"  (1.88 m)   Wt 240 lb 3.2 oz (109 kg)   SpO2 97%   BMI 30.84 kg/m    Physical Exam Vitals reviewed.  Constitutional:      General: He is not in acute distress.    Appearance: Normal appearance. He is obese. He is not ill-appearing.  HENT:     Head: Normocephalic and atraumatic.  Eyes:     Extraocular Movements: Extraocular movements intact.     Pupils: Pupils are equal, round, and reactive to light.  Cardiovascular:     Rate and Rhythm: Normal rate and regular rhythm.     Heart sounds: Normal heart sounds. No murmur heard.    No friction rub. No gallop.  Pulmonary:     Effort: Pulmonary effort is normal. No respiratory distress.     Breath sounds: Normal breath sounds. No wheezing.  Neurological:     Mental Status: He is alert and oriented to person, place, and time.     Cranial Nerves: No cranial nerve deficit.     Coordination: Coordination normal.     Gait: Gait normal.  Psychiatric:        Mood and Affect: Mood normal.        Behavior: Behavior normal.        Assessment/Plan: 1. Essential hypertension Continue amlodipine and atenolol as prescribed.  - amLODipine (NORVASC) 10 MG tablet; Take 1 tablet (10 mg total) by mouth daily.  Dispense: 90 tablet; Refill: 1 - atenolol (TENORMIN) 50 MG tablet; Take 1 tablet (50 mg total) by mouth daily.  Dispense: 90 tablet; Refill: 1  2. Mixed hyperlipidemia Continue fenofibrate and vascepa as prescribed  - fenofibrate (TRICOR) 48 MG tablet; Take 1 tablet (48 mg total) by mouth daily.  Dispense: 90 tablet; Refill: 1 - Icosapent Ethyl (VASCEPA) 0.5 g CAPS; Take 4 capsules (2 g total) by mouth 2 (two) times daily.  Dispense: 240 capsule; Refill: 3  3. Vitamin D deficiency Continue vitamin D supplement as prescribed  - ergocalciferol (DRISDOL) 1.25 MG (50000 UT) capsule; Take 1 capsule (50,000 Units  total) by mouth once a week.  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  Dispense: 90 tablet; Refill: 1   General Counseling: Mavric verbalizes understanding of the findings of todays visit and agrees with plan of treatment. I have discussed any further diagnostic evaluation that may be needed or ordered today. We also reviewed his medications today. he has been encouraged to call the office with any questions or concerns that should arise related to todays visit.    No orders of the defined types were placed in this encounter.   Meds ordered this encounter  Medications  amLODipine (NORVASC) 10 MG tablet    Sig: Take 1 tablet (10 mg total) by mouth daily.    Dispense:  90 tablet    Refill:  1   allopurinol (ZYLOPRIM) 100 MG tablet    Sig: Take 1 tablet (100 mg total) by mouth daily.    Dispense:  90 tablet    Refill:  1   atenolol (TENORMIN) 50 MG tablet    Sig: Take 1 tablet (50 mg total) by mouth daily.    Dispense:  90 tablet    Refill:  1   ergocalciferol (DRISDOL) 1.25 MG (50000 UT) capsule    Sig: Take 1 capsule (50,000 Units total) by mouth once a week.    Dispense:  4 capsule    Refill:  5   fenofibrate (TRICOR) 48 MG tablet    Sig: Take 1 tablet (48 mg total) by mouth daily.    Dispense:  90 tablet    Refill:  1   Icosapent Ethyl (VASCEPA) 0.5 g CAPS    Sig: Take 4 capsules (2 g total) by mouth 2 (two) times daily.    Dispense:  240 capsule    Refill:  3    Return for previously scheduled, CPE, Lisl Slingerland PCP in january .   Total time spent:30 Minutes Time spent includes review of chart, medications, test results, and follow up plan with the patient.   [place] Controlled Substance Database was reviewed by me.  This patient was seen by Sallyanne Kuster, FNP-C in collaboration with Dr. Beverely Risen as a part of collaborative care agreement.   Dartanion Teo R. Tedd Sias, MSN, FNP-C Internal medicine

## 2022-12-31 ENCOUNTER — Other Ambulatory Visit: Payer: Self-pay | Admitting: Nurse Practitioner

## 2022-12-31 DIAGNOSIS — I10 Essential (primary) hypertension: Secondary | ICD-10-CM

## 2022-12-31 DIAGNOSIS — M1A9XX0 Chronic gout, unspecified, without tophus (tophi): Secondary | ICD-10-CM

## 2023-03-04 ENCOUNTER — Telehealth: Payer: Self-pay | Admitting: Nurse Practitioner

## 2023-03-04 NOTE — Telephone Encounter (Signed)
 Bad telephone # for patient & emergency contact, sent mychart message to confirm 03/12/23 appointment-Toni

## 2023-03-12 ENCOUNTER — Ambulatory Visit (INDEPENDENT_AMBULATORY_CARE_PROVIDER_SITE_OTHER): Payer: BC Managed Care – PPO | Admitting: Nurse Practitioner

## 2023-03-12 ENCOUNTER — Encounter: Payer: Self-pay | Admitting: Nurse Practitioner

## 2023-03-12 VITALS — BP 138/82 | HR 60 | Temp 98.4°F | Resp 16 | Ht 74.0 in | Wt 241.8 lb

## 2023-03-12 DIAGNOSIS — Z0001 Encounter for general adult medical examination with abnormal findings: Secondary | ICD-10-CM

## 2023-03-12 DIAGNOSIS — I10 Essential (primary) hypertension: Secondary | ICD-10-CM

## 2023-03-12 DIAGNOSIS — M1A9XX0 Chronic gout, unspecified, without tophus (tophi): Secondary | ICD-10-CM

## 2023-03-12 DIAGNOSIS — E782 Mixed hyperlipidemia: Secondary | ICD-10-CM

## 2023-03-12 MED ORDER — ATENOLOL 50 MG PO TABS: 50.0000 mg | ORAL_TABLET | Freq: Every day | ORAL | 1 refills | Status: DC

## 2023-03-12 MED ORDER — INDOMETHACIN 50 MG PO CAPS: 50.0000 mg | ORAL_CAPSULE | Freq: Three times a day (TID) | ORAL | 3 refills | Status: DC | PRN

## 2023-03-12 MED ORDER — AMLODIPINE BESYLATE 10 MG PO TABS: 10.0000 mg | ORAL_TABLET | Freq: Every day | ORAL | 1 refills | Status: DC

## 2023-03-12 MED ORDER — ALLOPURINOL 100 MG PO TABS: 100.0000 mg | ORAL_TABLET | Freq: Every day | ORAL | 1 refills | Status: DC

## 2023-03-12 MED ORDER — FENOFIBRATE 48 MG PO TABS: 48.0000 mg | ORAL_TABLET | Freq: Every day | ORAL | 1 refills | Status: DC

## 2023-03-12 NOTE — Progress Notes (Signed)
[facility] [address] [place], [place] [address]  Internal MEDICINE  Office Visit Note  Patient Name: Anthony Rios  [id number]  [id number]  Date of Service: 03/12/2023  Chief Complaint  Patient presents with   Hypertension   Annual Exam    HPI Teja presents for an annual well visit and physical exam.  Well-appearing 48 y.o. male with gout, high cholesterol, and hypertension Routine CRC screening: cologuard due in 2026 Labs: due for labs in the middle of the year.  New or worsening pain: none  Other concerns: none Due for med refills today.     Current Medication: Outpatient Encounter Medications as of 03/12/2023  Medication Sig   ergocalciferol (DRISDOL) 1.25 MG (50000 UT) capsule Take 1 capsule (50,000 Units total) by mouth once a week.   Icosapent Ethyl (VASCEPA) 0.5 g CAPS Take 4 capsules (2 g total) by mouth 2 (two) times daily.   [DISCONTINUED] allopurinol (ZYLOPRIM) 100 MG tablet TAKE 1 TABLET BY MOUTH EVERY DAY   [DISCONTINUED] amLODipine (NORVASC) 10 MG tablet TAKE 1 TABLET BY MOUTH EVERY DAY   [DISCONTINUED] atenolol (TENORMIN) 50 MG tablet Take 1 tablet (50 mg total) by mouth daily.   [DISCONTINUED] fenofibrate (TRICOR) 48 MG tablet Take 1 tablet (48 mg total) by mouth daily.   allopurinol (ZYLOPRIM) 100 MG tablet Take 1 tablet (100 mg total) by mouth daily.   amLODipine (NORVASC) 10 MG tablet Take 1 tablet (10 mg total) by mouth daily.   atenolol (TENORMIN) 50 MG tablet Take 1 tablet (50 mg total) by mouth daily.   fenofibrate (TRICOR) 48 MG tablet Take 1 tablet (48 mg total) by mouth daily.   indomethacin (INDOCIN) 50 MG capsule Take 1 capsule (50 mg total) by mouth 3 (three) times daily as needed for moderate pain (pain score 4-6).   No facility-administered encounter medications on file as of 03/12/2023.    Surgical History: History reviewed. No pertinent surgical history.  Medical History: Past Medical History:  Diagnosis Date    Arthritis    Gout    Hypertension     Family History: Family History  Problem Relation Age of Onset   Hyperlipidemia Mother     Social History   Socioeconomic History   Marital status: Single    Spouse name: Not on file   Number of children: Not on file   Years of education: Not on file   Highest education level: Not on file  Occupational History   Not on file  Tobacco Use   Smoking status: Former   Smokeless tobacco: Never  Substance and Sexual Activity   Alcohol use: Yes   Drug use: Yes    Types: Marijuana   Sexual activity: Not on file  Other Topics Concern   Not on file  Social History Narrative   Not on file   Social Drivers of Health   Financial Resource Strain: Not on file  Food Insecurity: Not on file  Transportation Needs: Not on file  Physical Activity: Not on file  Stress: Not on file  Social Connections: Not on file  Intimate Partner Violence: Not on file      Review of Systems  Constitutional:  Negative for activity change, appetite change, chills, fatigue, fever and unexpected weight change.  HENT: Negative.  Negative for congestion, ear pain, rhinorrhea, sore throat and trouble swallowing.   Eyes: Negative.   Respiratory: Negative.  Negative for cough, chest tightness, shortness of breath and wheezing.   Cardiovascular: Negative.  Negative  for chest pain.  Gastrointestinal: Negative.  Negative for abdominal pain, blood in stool, constipation, diarrhea, nausea and vomiting.  Endocrine: Negative.   Genitourinary: Negative.  Negative for difficulty urinating, dysuria, frequency, hematuria and urgency.  Musculoskeletal: Negative.  Negative for arthralgias, back pain, joint swelling, myalgias and neck pain.  Skin: Negative.  Negative for rash and wound.  Allergic/Immunologic: Negative.  Negative for immunocompromised state.  Neurological: Negative.  Negative for dizziness, seizures, numbness and headaches.  Hematological: Negative.    Psychiatric/Behavioral: Negative.  Negative for behavioral problems, self-injury and suicidal ideas. The patient is not nervous/anxious.     Vital Signs: BP 138/82 Comment: 150/96  Pulse 60   Temp 98.4 F (36.9 C)   Resp 16   Ht 6\' 2"  (1.88 m)   Wt 241 lb 12.8 oz (109.7 kg)   SpO2 99%   BMI 31.05 kg/m    Physical Exam Vitals reviewed.  Constitutional:      General: He is awake. He is not in acute distress.    Appearance: Normal appearance. He is well-developed and well-groomed. He is obese. He is not ill-appearing or diaphoretic.  HENT:     Head: Normocephalic and atraumatic.     Right Ear: Tympanic membrane, ear canal and external ear normal.     Left Ear: Tympanic membrane, ear canal and external ear normal.     Nose: Nose normal. No congestion or rhinorrhea.     Mouth/Throat:     Lips: Pink.     Mouth: Mucous membranes are moist.     Pharynx: Oropharynx is clear. Uvula midline. No oropharyngeal exudate or posterior oropharyngeal erythema.  Eyes:     General: Lids are normal. Vision grossly intact. Gaze aligned appropriately. No scleral icterus.       Right eye: No discharge.        Left eye: No discharge.     Extraocular Movements: Extraocular movements intact.     Conjunctiva/sclera: Conjunctivae normal.     Pupils: Pupils are equal, round, and reactive to light.     Funduscopic exam:    Right eye: Red reflex present.        Left eye: Red reflex present. Neck:     Thyroid: No thyromegaly.     Vascular: No JVD.     Trachea: Trachea and phonation normal. No tracheal deviation.  Cardiovascular:     Rate and Rhythm: Normal rate and regular rhythm.     Pulses: Normal pulses.     Heart sounds: Normal heart sounds, S1 normal and S2 normal. No murmur heard.    No friction rub. No gallop.  Pulmonary:     Effort: Pulmonary effort is normal. No accessory muscle usage or respiratory distress.     Breath sounds: Normal breath sounds and air entry. No stridor. No wheezing  or rales.  Chest:     Chest wall: No tenderness.  Abdominal:     General: Bowel sounds are normal. There is no distension.     Palpations: Abdomen is soft. There is no shifting dullness, fluid wave, mass or pulsatile mass.     Tenderness: There is no abdominal tenderness. There is no guarding or rebound.  Musculoskeletal:        General: No tenderness or deformity. Normal range of motion.     Cervical back: Normal range of motion and neck supple.     Right lower leg: No edema.     Left lower leg: No edema.  Lymphadenopathy:  Cervical: No cervical adenopathy.  Skin:    General: Skin is warm and dry.     Capillary Refill: Capillary refill takes less than 2 seconds.     Coloration: Skin is not pale.     Findings: No erythema or rash.  Neurological:     Mental Status: He is alert and oriented to person, place, and time.     Cranial Nerves: No cranial nerve deficit.     Motor: No abnormal muscle tone.     Coordination: Coordination normal.     Gait: Gait normal.     Deep Tendon Reflexes: Reflexes are normal and symmetric.  Psychiatric:        Mood and Affect: Mood normal.        Behavior: Behavior normal. Behavior is cooperative.        Thought Content: Thought content normal.        Judgment: Judgment normal.        Assessment/Plan: 1. Encounter for routine adult health examination with abnormal findings (Primary) Age-appropriate preventive screenings and vaccinations discussed, annual physical exam completed. Routine labs for health maintenance will be ordered to be done in early July this year. PHM updated.    2. Essential hypertension Stable, continue amlodipine and atenolol as prescribed.  - amLODipine (NORVASC) 10 MG tablet; Take 1 tablet (10 mg total) by mouth daily.  Dispense: 90 tablet; Refill: 1 - atenolol (TENORMIN) 50 MG tablet; Take 1 tablet (50 mg total) by mouth daily.  Dispense: 90 tablet; Refill: 1  3. Mixed hyperlipidemia Continue fenofibrate as  prescribed.  - fenofibrate (TRICOR) 48 MG tablet; Take 1 tablet (48 mg total) by mouth daily.  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  - indomethacin (INDOCIN) 50 MG capsule; Take 1 capsule (50 mg total) by mouth 3 (three) times daily as needed for moderate pain (pain score 4-6).  Dispense: 90 capsule; Refill: 3 - allopurinol (ZYLOPRIM) 100 MG tablet; Take 1 tablet (100 mg total) by mouth daily.  Dispense: 90 tablet; Refill: 1     General Counseling: Tesla verbalizes understanding of the findings of todays visit and agrees with plan of treatment. I have discussed any further diagnostic evaluation that may be needed or ordered today. We also reviewed his medications today. he has been encouraged to call the office with any questions or concerns that should arise related to todays visit.    No orders of the defined types were placed in this encounter.   Meds ordered this encounter  Medications   indomethacin (INDOCIN) 50 MG capsule    Sig: Take 1 capsule (50 mg total) by mouth 3 (three) times daily as needed for moderate pain (pain score 4-6).    Dispense:  90 capsule    Refill:  3    Fill new script today   allopurinol (ZYLOPRIM) 100 MG tablet    Sig: Take 1 tablet (100 mg total) by mouth daily.    Dispense:  90 tablet    Refill:  1   amLODipine (NORVASC) 10 MG tablet    Sig: Take 1 tablet (10 mg total) by mouth daily.    Dispense:  90 tablet    Refill:  1   atenolol (TENORMIN) 50 MG tablet    Sig: Take 1 tablet (50 mg total) by mouth daily.    Dispense:  90 tablet    Refill:  1   fenofibrate (TRICOR)  48 MG tablet    Sig: Take 1 tablet (48 mg total) by mouth daily.    Dispense:  90 tablet    Refill:  1    Return in about 6 months (around 09/09/2023) for F/U, Labs, Anslie Spadafora PCP, have labs done prior to [place] office visit .   Total time spent:30 Minutes Time  spent includes review of chart, medications, test results, and follow up plan with the patient.   [place] Controlled Substance Database was reviewed by me.  This patient was seen by Sallyanne Kuster, FNP-C in collaboration with Dr. Beverely Risen as a part of collaborative care agreement.  Elianne Gubser R. Tedd Sias, MSN, FNP-C Internal medicine

## 2023-04-09 ENCOUNTER — Emergency Department (HOSPITAL_BASED_OUTPATIENT_CLINIC_OR_DEPARTMENT_OTHER)
Admission: EM | Admit: 2023-04-09 | Discharge: 2023-04-09 | Disposition: A | Discharge status: Home / Self Care | Payer: BC Managed Care – PPO | Attending: Emergency Medicine | Admitting: Emergency Medicine

## 2023-04-09 ENCOUNTER — Emergency Department (HOSPITAL_BASED_OUTPATIENT_CLINIC_OR_DEPARTMENT_OTHER): Payer: BC Managed Care – PPO

## 2023-04-09 ENCOUNTER — Other Ambulatory Visit: Payer: Self-pay

## 2023-04-09 DIAGNOSIS — R0789 Other chest pain: Secondary | ICD-10-CM | POA: Diagnosis not present

## 2023-04-09 DIAGNOSIS — Z87891 Personal history of nicotine dependence: Secondary | ICD-10-CM | POA: Insufficient documentation

## 2023-04-09 DIAGNOSIS — Z79899 Other long term (current) drug therapy: Secondary | ICD-10-CM | POA: Insufficient documentation

## 2023-04-09 DIAGNOSIS — R079 Chest pain, unspecified: Secondary | ICD-10-CM | POA: Diagnosis not present

## 2023-04-09 DIAGNOSIS — I10 Essential (primary) hypertension: Secondary | ICD-10-CM | POA: Insufficient documentation

## 2023-04-09 DIAGNOSIS — U071 COVID-19: Secondary | ICD-10-CM | POA: Diagnosis not present

## 2023-04-09 LAB — CBC
HCT: 43 % (ref 39.0–52.0)
Hemoglobin: 14.3 g/dL (ref 13.0–17.0)
MCH: 28.3 pg (ref 26.0–34.0)
MCHC: 33.3 g/dL (ref 30.0–36.0)
MCV: 85 fL (ref 80.0–100.0)
Platelets: 171 10*3/uL (ref 150–400)
RBC: 5.06 MIL/uL (ref 4.22–5.81)
RDW: 11.7 % (ref 11.5–15.5)
WBC: 4.8 10*3/uL (ref 4.0–10.5)
nRBC: 0 % (ref 0.0–0.2)

## 2023-04-09 LAB — BASIC METABOLIC PANEL
Anion gap: 9 (ref 5–15)
BUN: 17 mg/dL (ref 6–20)
CO2: 25 mmol/L (ref 22–32)
Calcium: 9 mg/dL (ref 8.9–10.3)
Chloride: 104 mmol/L (ref 98–111)
Creatinine, Ser: 1.27 mg/dL — ABNORMAL HIGH (ref 0.61–1.24)
GFR, Estimated: >60 mL/min (ref >60)
Glucose, Bld: 108 mg/dL — ABNORMAL HIGH (ref 70–99)
Potassium: 3.5 mmol/L (ref 3.5–5.1)
Sodium: 138 mmol/L (ref 135–145)

## 2023-04-09 LAB — TROPONIN I (HIGH SENSITIVITY)
Troponin I (High Sensitivity): <2 ng/L (ref <18)
Troponin I (High Sensitivity): 2 ng/L (ref <18)

## 2023-04-09 LAB — RESP PANEL BY RT-PCR (RSV, FLU A&B, COVID)  RVPGX2
Influenza A by PCR: NEGATIVE
Influenza B by PCR: NEGATIVE
Resp Syncytial Virus by PCR: NEGATIVE
SARS Coronavirus 2 by RT PCR: POSITIVE — AB

## 2023-04-09 NOTE — ED Triage Notes (Signed)
Pt complaints of lt sided chest pain, started yesterday. Endorses SOB

## 2023-04-09 NOTE — Discharge Instructions (Addendum)
You have been seen today for your complaint of chest pain, upper respiratory infection symptoms. Your lab work was positive for COVID-19 but was otherwise reassuring. Your imaging was reassuring and showed no abnormalities. Your discharge medications include Alternate tylenol and ibuprofen for pain. You may alternate these every 4 hours. You may take up to 800 mg of ibuprofen at a time and up to 1000 mg of tylenol. Claritin and Flonase for congestion and runny nose. Follow up with: your primary care provider Please seek immediate medical care if you develop any of the following symptoms: Your chest pain gets worse. You have a cough that gets worse, or you cough up blood. You have severe pain in your abdomen. You faint. You have sudden, unexplained chest discomfort. You have sudden, unexplained discomfort in your arms, back, neck, or jaw. You have shortness of breath at any time. You suddenly start to sweat, or your skin gets clammy. You feel nausea or you vomit. You suddenly feel lightheaded or dizzy. You have severe weakness, or unexplained weakness or fatigue. Your heart begins to beat quickly, or it feels like it is skipping beats. At this time there does not appear to be the presence of an emergent medical condition, however there is always the potential for conditions to change. Please read and follow the below instructions.  Do not take your medicine if  develop an itchy rash, swelling in your mouth or lips, or difficulty breathing; call 911 and seek immediate emergency medical attention if this occurs.  You may review your lab tests and imaging results in their entirety on your MyChart account.  Please discuss all results of fully with your primary care provider and other specialist at your follow-up visit.  Note: Portions of this text may have been transcribed using voice recognition software. Every effort was made to ensure accuracy; however, inadvertent computerized transcription  errors may still be present.

## 2023-04-09 NOTE — ED Provider Notes (Signed)
[facility] Provider Note   CSN: [id number] Arrival date & time: 04/09/23  1515     History  Chief Complaint  Patient presents with   Chest Pain    Anthony Rios is a 48 y.o. male.  With history of hypertension, gout, hypertriglyceridemia presenting to the ED for evaluation of chest pain.  Symptoms began yesterday afternoon.  Shortly before symptom onset, he developed congestion, rhinorrhea and a cough.  His significant other was diagnosed with the flu recently.  He has multiple sick contacts at work as well.  Pain is localized to the left of the sternum.  Does not radiate.  No associated nausea, vomiting or diaphoresis.  No significant cardiac history.  No family cardiac history.  Symptoms are not exertional.  Described as a tightness.  He denies shortness of breath, fevers, hemoptysis, unilateral leg swelling, history of DVT or PE, recent cancer treatments, surgeries, long distance travel, testosterone or estrogen use.  Chest pain is intermittent with no aggravating or alleviating factors.   Chest Pain Associated symptoms: cough        Home Medications Prior to Admission medications   Medication Sig Start Date End Date Taking? Authorizing Provider  allopurinol (ZYLOPRIM) 100 MG tablet Take 1 tablet (100 mg total) by mouth daily. 03/12/23   Sallyanne Kuster, NP  amLODipine (NORVASC) 10 MG tablet Take 1 tablet (10 mg total) by mouth daily. 03/12/23   Sallyanne Kuster, NP  atenolol (TENORMIN) 50 MG tablet Take 1 tablet (50 mg total) by mouth daily. 03/12/23   Sallyanne Kuster, NP  ergocalciferol (DRISDOL) 1.25 MG (50000 UT) capsule Take 1 capsule (50,000 Units total) by mouth once a week. 09/02/22   Sallyanne Kuster, NP  fenofibrate (TRICOR) 48 MG tablet Take 1 tablet (48 mg total) by mouth daily. 03/12/23   Sallyanne Kuster, NP  Icosapent Ethyl (VASCEPA) 0.5 g CAPS Take 4 capsules (2 g total) by mouth 2 (two) times daily. 09/02/22    Sallyanne Kuster, NP  indomethacin (INDOCIN) 50 MG capsule Take 1 capsule (50 mg total) by mouth 3 (three) times daily as needed for moderate pain (pain score 4-6). 03/12/23   Sallyanne Kuster, NP      Allergies    Ibuprofen    Review of Systems   Review of Systems  HENT:  Positive for rhinorrhea.   Respiratory:  Positive for cough.   Cardiovascular:  Positive for chest pain.  All other systems reviewed and are negative.   Physical Exam Updated Vital Signs BP (!) 147/89   Pulse (!) 59   Temp 98.2 F (36.8 C) (Oral)   Resp 12   SpO2 99%  Physical Exam Vitals and nursing note reviewed.  Constitutional:      General: He is not in acute distress.    Appearance: Normal appearance. He is normal weight. He is not ill-appearing.     Comments: Resting comfortably in bed  HENT:     Head: Normocephalic and atraumatic.  Cardiovascular:     Rate and Rhythm: Normal rate and regular rhythm.  Pulmonary:     Effort: Pulmonary effort is normal. No respiratory distress.     Breath sounds: No decreased breath sounds, wheezing, rhonchi or rales.  Chest:     Chest wall: No tenderness.     Comments: No rashes Abdominal:     General: Abdomen is flat.  Musculoskeletal:        General: Normal range of motion.     Cervical  back: Neck supple.  Skin:    General: Skin is warm and dry.  Neurological:     Mental Status: He is alert and oriented to person, place, and time.  Psychiatric:        Mood and Affect: Mood normal.        Behavior: Behavior normal.     ED Results / Procedures / Treatments   Labs (all labs ordered are listed, but only abnormal results are displayed) Labs Reviewed  RESP PANEL BY RT-PCR (RSV, FLU A&B, COVID)  RVPGX2 - Abnormal; Notable for the following components:      Result Value   SARS Coronavirus 2 by RT PCR POSITIVE (*)    All other components within normal limits  BASIC METABOLIC PANEL - Abnormal; Notable for the following components:   Glucose, Bld 108 (*)     Creatinine, Ser 1.27 (*)    All other components within normal limits  CBC  TROPONIN I (HIGH SENSITIVITY)  TROPONIN I (HIGH SENSITIVITY)    EKG EKG Interpretation Date/Time:  Friday April 09 2023 15:23:43 EST Ventricular Rate:  63 PR Interval:  187 QRS Duration:  91 QT Interval:  399 QTC Calculation: 409 R Axis:   43  Text Interpretation: Sinus rhythm No old tracing to compare Confirmed by Rolan Bucco [phone]) on 04/09/2023 4:02:59 PM  Radiology DG Chest 2 View Result Date: 04/09/2023 CLINICAL DATA:  chest pain ex-smoker. EXAM: CHEST - 2 VIEW COMPARISON:  None Available. FINDINGS: The heart and mediastinal contours are within normal limits. No focal consolidation. No pulmonary edema. No pleural effusion. No pneumothorax. No acute osseous abnormality. IMPRESSION: No active cardiopulmonary disease. Electronically Signed   By: Tish Frederickson M.D.   On: 04/09/2023 17:57    Procedures Procedures    Medications Ordered in ED Medications - No data to display  ED Course/ Medical Decision Making/ A&P             HEART Score: 2                  PERC Score: 0, PERC Score Interpretation: No need for further workup, as <2% chance of PE.  If no criteria are positive and clinicians pre-test probability is <15%, PERC Rule criteria are satisfied Medical Decision Making Amount and/or Complexity of Data Reviewed Labs: ordered. Radiology: ordered.  This patient presents to the ED for concern of chest pain, this involves an extensive number of treatment options, and is a complaint that carries with it a high risk of complications and morbidity.  The emergent differential diagnosis of chest pain includes: Acute coronary syndrome, pericarditis, aortic dissection, pulmonary embolism, tension pneumothorax, and esophageal rupture.  I do not believe the patient has an emergent cause of chest pain, other urgent/non-acute considerations include, but are not limited to: chronic angina, aortic  stenosis, cardiomyopathy, myocarditis, mitral valve prolapse, pulmonary hypertension, hypertrophic obstructive cardiomyopathy (HOCM), aortic insufficiency, right ventricular hypertrophy, pneumonia, pleuritis, bronchitis, pneumothorax, tumor, gastroesophageal reflux disease (GERD), esophageal spasm, Mallory-Weiss syndrome, peptic ulcer disease, biliary disease, pancreatitis, functional gastrointestinal pain, cervical or thoracic disk disease or arthritis, shoulder arthritis, costochondritis, subacromial bursitis, anxiety or panic attack, herpes zoster, breast disorders, chest wall tumors, thoracic outlet syndrome, mediastinitis.  My initial workup includes ACS rule out, respiratory panel  Additional history obtained from: Nursing notes from this visit.  I ordered, reviewed and interpreted labs which include: CBC, BMP, troponin, delta troponin.  No leukocytosis or anemia.  No electrolyte derangement.  Creatinine near baseline.  Initial troponin  negative.  Delta troponin negative.  Respiratory panel positive for COVID-19  I ordered imaging studies including chest x-ray I independently visualized and interpreted imaging which showed negative I agree with the radiologist interpretation  Cardiac Monitoring:  The patient was maintained on a cardiac monitor.  I personally viewed and interpreted the cardiac monitored which showed an underlying rhythm of: NSR  48 year old male presents to the ED for evaluation of left-sided chest pain.  Symptoms began shortly after developing upper respiratory infection symptoms.  Significant other recently tested positive for influenza.  EKG without acute ischemic changes.  Initial troponin negative.  Chest x-ray negative.  Low suspicion for ACS.  Heart score of 2.  PERC negative, lower suspicion for PE.  Respiratory panel positive for COVID-19.  Likely source of his symptoms.  He was educated on typical timeline of symptoms and supportive care.  He declines Paxlovid.  He  was encouraged to follow-up with his primary care provider.  He was given return precautions.  Stable at discharge.  At this time there does not appear to be any evidence of an acute emergency medical condition and the patient appears stable for discharge with appropriate outpatient follow up. Diagnosis was discussed with patient who verbalizes understanding of care plan and is agreeable to discharge. I have discussed return precautions with patient who verbalizes understanding. Patient encouraged to follow-up with their PCP within 1 week. All questions answered.  Note: Portions of this report may have been transcribed using voice recognition software. Every effort was made to ensure accuracy; however, inadvertent computerized transcription errors may still be present.         Final Clinical Impression(s) / ED Diagnoses Final diagnoses:  COVID-19  Atypical chest pain    Rx / DC Orders ED Discharge Orders     None         Michelle Piper, Cordelia Poche 04/09/23 1840    Rolan Bucco, MD 04/09/23 [phone]

## 2023-04-21 ENCOUNTER — Ambulatory Visit: Payer: BC Managed Care – PPO | Admitting: Nurse Practitioner

## 2023-06-25 ENCOUNTER — Ambulatory Visit (INDEPENDENT_AMBULATORY_CARE_PROVIDER_SITE_OTHER): Admitting: Nurse Practitioner

## 2023-06-25 ENCOUNTER — Encounter: Payer: Self-pay | Admitting: Nurse Practitioner

## 2023-06-25 VITALS — BP 155/94 | HR 72 | Temp 98.8°F | Resp 16 | Ht 74.0 in | Wt 251.4 lb

## 2023-06-25 DIAGNOSIS — R609 Edema, unspecified: Secondary | ICD-10-CM | POA: Diagnosis not present

## 2023-06-25 DIAGNOSIS — I10 Essential (primary) hypertension: Secondary | ICD-10-CM | POA: Diagnosis not present

## 2023-06-25 DIAGNOSIS — M1A9XX0 Chronic gout, unspecified, without tophus (tophi): Secondary | ICD-10-CM

## 2023-06-25 DIAGNOSIS — E782 Mixed hyperlipidemia: Secondary | ICD-10-CM | POA: Diagnosis not present

## 2023-06-25 MED ORDER — ATENOLOL 50 MG PO TABS: 50.0000 mg | ORAL_TABLET | Freq: Every day | ORAL | 1 refills | Status: AC

## 2023-06-25 MED ORDER — FENOFIBRATE 48 MG PO TABS
48.0000 mg | ORAL_TABLET | Freq: Every day | ORAL | 1 refills | Status: AC
Start: 2023-06-25 — End: ?

## 2023-06-25 MED ORDER — ICOSAPENT ETHYL 0.5 G PO CAPS: 4.0000 | ORAL_CAPSULE | Freq: Two times a day (BID) | ORAL | 3 refills | Status: DC

## 2023-06-25 MED ORDER — ALLOPURINOL 100 MG PO TABS
100.0000 mg | ORAL_TABLET | Freq: Every day | ORAL | 1 refills | Status: DC
Start: 2023-06-25 — End: 2024-01-03

## 2023-06-25 MED ORDER — AMLODIPINE BESYLATE 10 MG PO TABS: 10.0000 mg | ORAL_TABLET | Freq: Every day | ORAL | 1 refills | Status: DC

## 2023-06-25 MED ORDER — HYDROCHLOROTHIAZIDE 12.5 MG PO CAPS: 12.5000 mg | ORAL_CAPSULE | Freq: Every day | ORAL | 3 refills | Status: DC

## 2023-06-25 MED ORDER — INDOMETHACIN 50 MG PO CAPS: 50.0000 mg | ORAL_CAPSULE | Freq: Three times a day (TID) | ORAL | 3 refills | Status: AC | PRN

## 2023-06-25 NOTE — Progress Notes (Signed)
 [facility] [address] [place], [place] [address]  Internal MEDICINE  Office Visit Note  Patient Name: Anthony Rios  [id number]  [id number]  Date of Service: 06/25/2023  Chief Complaint  Patient presents with   Acute Visit    Feet swelling x few days     HPI Zuhayr presents for a follow-up visit for swelling of feet for the past few days.  Lower extremity edema primarily in the feet -- no pain, redness or drainage. No signs of infection. Recently moved and has a new job where he is sitting down most of the time for about 8-9 hours per day. This is most likely dependent edema. Does have hypertension and his BP is elevated., improved some when rechecked but still elevated Increased stress level due to the move and job change. High cholesterol -- taking vascepa  and fenofibrate       Current Medication: Outpatient Encounter Medications as of 06/25/2023  Medication Sig   ergocalciferol  (DRISDOL ) 1.25 MG (50000 UT) capsule Take 1 capsule (50,000 Units total) by mouth once a week.   hydrochlorothiazide (MICROZIDE) 12.5 MG capsule Take 1 capsule (12.5 mg total) by mouth daily.   [DISCONTINUED] allopurinol  (ZYLOPRIM ) 100 MG tablet Take 1 tablet (100 mg total) by mouth daily.   [DISCONTINUED] amLODipine  (NORVASC ) 10 MG tablet Take 1 tablet (10 mg total) by mouth daily.   [DISCONTINUED] atenolol  (TENORMIN ) 50 MG tablet Take 1 tablet (50 mg total) by mouth daily.   [DISCONTINUED] fenofibrate  (TRICOR ) 48 MG tablet Take 1 tablet (48 mg total) by mouth daily.   [DISCONTINUED] Icosapent  Ethyl (VASCEPA ) 0.5 g CAPS Take 4 capsules (2 g total) by mouth 2 (two) times daily.   [DISCONTINUED] indomethacin  (INDOCIN ) 50 MG capsule Take 1 capsule (50 mg total) by mouth 3 (three) times daily as needed for moderate pain (pain score 4-6).   allopurinol  (ZYLOPRIM ) 100 MG tablet Take 1 tablet (100 mg total) by mouth daily.   amLODipine  (NORVASC ) 10 MG tablet Take 1 tablet (10 mg total) by  mouth daily.   atenolol  (TENORMIN ) 50 MG tablet Take 1 tablet (50 mg total) by mouth daily.   fenofibrate  (TRICOR ) 48 MG tablet Take 1 tablet (48 mg total) by mouth daily.   Icosapent  Ethyl (VASCEPA ) 0.5 g CAPS Take 4 capsules (2 g total) by mouth 2 (two) times daily.   indomethacin  (INDOCIN ) 50 MG capsule Take 1 capsule (50 mg total) by mouth 3 (three) times daily as needed for moderate pain (pain score 4-6).   No facility-administered encounter medications on file as of 06/25/2023.    Surgical History: History reviewed. No pertinent surgical history.  Medical History: Past Medical History:  Diagnosis Date   Arthritis    Gout    Hypertension     Family History: Family History  Problem Relation Age of Onset   Hyperlipidemia Mother     Social History   Socioeconomic History   Marital status: Single    Spouse name: Not on file   Number of children: Not on file   Years of education: Not on file   Highest education level: Not on file  Occupational History   Not on file  Tobacco Use   Smoking status: Former   Smokeless tobacco: Never  Substance and Sexual Activity   Alcohol use: Yes   Drug use: Yes    Types: Marijuana   Sexual activity: Not on file  Other Topics Concern   Not on file  Social History Narrative   Not  on file   Social Drivers of Health   Financial Resource Strain: Not on file  Food Insecurity: Not on file  Transportation Needs: Not on file  Physical Activity: Not on file  Stress: Not on file  Social Connections: Not on file  Intimate Partner Violence: Not on file      Review of Systems  Constitutional: Negative.  Negative for fatigue.  Respiratory: Negative.  Negative for cough, chest tightness, shortness of breath and wheezing.   Cardiovascular:  Positive for leg swelling. Negative for chest pain and palpitations.  Gastrointestinal: Negative.   Genitourinary: Negative.   Neurological: Negative.     Vital Signs: BP (!) 155/94 Comment:  170/90  Pulse 72   Temp 98.8 F (37.1 C)   Resp 16   Ht 6\' 2"  (1.88 m)   Wt 251 lb 6.4 oz (114 kg)   SpO2 98%   BMI 32.28 kg/m    Physical Exam Vitals reviewed.  Constitutional:      General: He is not in acute distress.    Appearance: Normal appearance. He is obese. He is not ill-appearing.  HENT:     Head: Normocephalic and atraumatic.  Eyes:     Pupils: Pupils are equal, round, and reactive to light.  Cardiovascular:     Rate and Rhythm: Normal rate and regular rhythm.  Pulmonary:     Effort: Pulmonary effort is normal. No respiratory distress.  Musculoskeletal:     Right lower leg: Edema present.     Left lower leg: Edema present.  Neurological:     Mental Status: He is alert and oriented to person, place, and time.  Psychiatric:        Mood and Affect: Mood normal.        Behavior: Behavior normal.        Assessment/Plan: 1. Dependent edema (Primary) Routine labs ordered. Start hydrochlorothiazide as prescribed.  - CBC with Differential/Platelet - CMP14+EGFR - Lipid Profile - Vitamin D  (25 hydroxy) - hydrochlorothiazide (MICROZIDE) 12.5 MG capsule; Take 1 capsule (12.5 mg total) by mouth daily.  Dispense: 30 capsule; Refill: 3  2. Essential hypertension Continue amlodipine  and atenolol  as prescribed start hydrochlorothiazide as prescribed. Routine labs ordered  - CBC with Differential/Platelet - CMP14+EGFR - Lipid Profile - Vitamin D  (25 hydroxy) - hydrochlorothiazide (MICROZIDE) 12.5 MG capsule; Take 1 capsule (12.5 mg total) by mouth daily.  Dispense: 30 capsule; Refill: 3 - amLODipine  (NORVASC ) 10 MG tablet; Take 1 tablet (10 mg total) by mouth daily.  Dispense: 90 tablet; Refill: 1 - atenolol  (TENORMIN ) 50 MG tablet; Take 1 tablet (50 mg total) by mouth daily.  Dispense: 90 tablet; Refill: 1  3. Mixed hyperlipidemia Continue fenofibrate  and vascepa  as prescribed. Routine labs ordered  - CBC with Differential/Platelet - CMP14+EGFR - Lipid  Profile - Vitamin D  (25 hydroxy) - fenofibrate  (TRICOR ) 48 MG tablet; Take 1 tablet (48 mg total) by mouth daily.  Dispense: 90 tablet; Refill: 1 - Icosapent  Ethyl (VASCEPA ) 0.5 g CAPS; Take 4 capsules (2 g total) by mouth 2 (two) times daily.  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  - allopurinol  (ZYLOPRIM ) 100 MG tablet; Take 1 tablet (100 mg total) by mouth daily.  Dispense: 90 tablet; Refill: 1 - indomethacin  (INDOCIN ) 50 MG capsule; Take 1 capsule (50 mg total) by mouth 3 (three) times daily as needed for moderate pain (pain score 4-6).  Dispense: 90 capsule; Refill:  3   General Counseling: Hollister verbalizes understanding of the findings of todays visit and agrees with plan of treatment. I have discussed any further diagnostic evaluation that may be needed or ordered today. We also reviewed his medications today. he has been encouraged to call the office with any questions or concerns that should arise related to todays visit.    Orders Placed This Encounter  Procedures   CBC with Differential/Platelet   CMP14+EGFR   Lipid Profile   Vitamin D  (25 hydroxy)    Meds ordered this encounter  Medications   hydrochlorothiazide (MICROZIDE) 12.5 MG capsule    Sig: Take 1 capsule (12.5 mg total) by mouth daily.    Dispense:  30 capsule    Refill:  3   allopurinol  (ZYLOPRIM ) 100 MG tablet    Sig: Take 1 tablet (100 mg total) by mouth daily.    Dispense:  90 tablet    Refill:  1   amLODipine  (NORVASC ) 10 MG tablet    Sig: Take 1 tablet (10 mg total) by mouth daily.    Dispense:  90 tablet    Refill:  1   atenolol  (TENORMIN ) 50 MG tablet    Sig: Take 1 tablet (50 mg total) by mouth daily.    Dispense:  90 tablet    Refill:  1   fenofibrate  (TRICOR ) 48 MG tablet    Sig: Take 1 tablet (48 mg total) by mouth daily.    Dispense:  90 tablet    Refill:  1   Icosapent   Ethyl (VASCEPA ) 0.5 g CAPS    Sig: Take 4 capsules (2 g total) by mouth 2 (two) times daily.    Dispense:  240 capsule    Refill:  3   indomethacin  (INDOCIN ) 50 MG capsule    Sig: Take 1 capsule (50 mg total) by mouth 3 (three) times daily as needed for moderate pain (pain score 4-6).    Dispense:  90 capsule    Refill:  3    Fill new script today    Return in about 1 month (around 07/26/2023) for F/U , eval new med, Ariadne Rissmiller PCP, BP check.   Total time spent:30 Minutes Time spent includes review of chart, medications, test results, and follow up plan with the patient.   [place] Controlled Substance Database was reviewed by me.  This patient was seen by Laurence Pons, FNP-C in collaboration with Dr. Verneta Gone as a part of collaborative care agreement.   Mechel Schutter R. Bobbi Burow, MSN, FNP-C Internal medicine

## 2023-07-26 ENCOUNTER — Ambulatory Visit: Admitting: Nurse Practitioner

## 2023-08-06 ENCOUNTER — Telehealth: Payer: Self-pay

## 2023-08-09 NOTE — Telephone Encounter (Signed)
 Patient notified

## 2023-09-07 ENCOUNTER — Ambulatory Visit: Admitting: Internal Medicine

## 2023-09-07 ENCOUNTER — Encounter: Payer: Self-pay | Admitting: Internal Medicine

## 2023-09-07 VITALS — BP 162/92 | HR 80 | Temp 98.3°F | Resp 18 | Ht 74.0 in | Wt 250.4 lb

## 2023-09-07 DIAGNOSIS — R600 Localized edema: Secondary | ICD-10-CM | POA: Insufficient documentation

## 2023-09-07 DIAGNOSIS — Z6832 Body mass index (BMI) 32.0-32.9, adult: Secondary | ICD-10-CM

## 2023-09-07 NOTE — Progress Notes (Signed)
 Office Visit  Subjective   Patient ID: Anthony Rios   DOB: Jul 24, 1975   Age: 48 y.o.   MRN: [id number]   Chief Complaint Chief Complaint  Patient presents with   Establish Care    Pt in today to establish care with [facility], his last PCP was at [facility] in [place]. The patient reports his feet has started swelling in the past couple of month, states he has started taking a fluid pill, states he used to stand on his feet all day but is now sitting at a desk.      History of Present Illness Anthony Rios is a 48 yo male who comes in today to establish care.  He was previously seen by Sigrid Bathe with last visit 2 months ago.  He comes in today with a new complaint where he has noticed over the last 2 months that he has been having swelling of his bilateral LE's with left worse than right.  He states he gets swelling from his feet up to his mid ankle.  He goes to bed at night and the swelling is improved in the morning and the longer he stands and is on his feet, the worse his swelling becomes.  There is no rash or color changes of his legs.  He use to work in [employer] and would stand up and walking on concrete floors most of his life but he just started a new office job and he has noticed this swelling.  There has been no chest pain, palpitations, or SOB, and no orthopnea.       Past Medical History Past Medical History:  Diagnosis Date   Arthritis    Essential hypertension    Gout    Hypertriglyceridemia      Allergies Allergies  Allergen Reactions   Ibuprofen      Medications  Current Outpatient Medications:    allopurinol  (ZYLOPRIM ) 100 MG tablet, Take 1 tablet (100 mg total) by mouth daily., Disp: 90 tablet, Rfl: 1   amLODipine  (NORVASC ) 10 MG tablet, Take 1 tablet (10 mg total) by mouth daily., Disp: 90 tablet, Rfl: 1   atenolol  (TENORMIN ) 50 MG tablet, Take 1 tablet (50 mg total) by mouth daily., Disp: 90 tablet, Rfl: 1   fenofibrate  (TRICOR ) 48 MG  tablet, Take 1 tablet (48 mg total) by mouth daily., Disp: 90 tablet, Rfl: 1   hydrochlorothiazide  (MICROZIDE ) 12.5 MG capsule, Take 1 capsule (12.5 mg total) by mouth daily., Disp: 30 capsule, Rfl: 3   Icosapent  Ethyl (VASCEPA ) 0.5 g CAPS, Take 4 capsules (2 g total) by mouth 2 (two) times daily., Disp: 240 capsule, Rfl: 3   indomethacin  (INDOCIN ) 50 MG capsule, Take 1 capsule (50 mg total) by mouth 3 (three) times daily as needed for moderate pain (pain score 4-6)., Disp: 90 capsule, Rfl: 3   Omega-3 Fatty Acids (FISH OIL) 300 MG CAPS, Take by mouth., Disp: , Rfl:    Review of Systems Review of Systems  Constitutional:  Negative for chills and fever.  Eyes:  Negative for blurred vision and double vision.  Respiratory:  Negative for cough, shortness of breath and wheezing.   Cardiovascular:  Positive for leg swelling. Negative for chest pain, palpitations and orthopnea.  Gastrointestinal:  Negative for abdominal pain, constipation, diarrhea, nausea and vomiting.  Neurological:  Negative for dizziness, weakness and headaches.       Objective:    Vitals BP (!) 162/92   Pulse 80   Temp 98.3  F (36.8 C) (Temporal)   Resp 18   Ht 6' 2 (1.88 m)   Wt 250 lb 6.4 oz (113.6 kg)   SpO2 98%   BMI 32.15 kg/m    Physical Examination Physical Exam Constitutional:      Appearance: Normal appearance. He is not ill-appearing.  Cardiovascular:     Rate and Rhythm: Normal rate and regular rhythm.     Pulses: Normal pulses.     Heart sounds: No murmur heard.    No friction rub. No gallop.  Pulmonary:     Effort: Pulmonary effort is normal. No respiratory distress.     Breath sounds: No wheezing, rhonchi or rales.  Abdominal:     General: Abdomen is flat. Bowel sounds are normal. There is no distension.     Palpations: Abdomen is soft.     Tenderness: There is no abdominal tenderness.  Musculoskeletal:     Right lower leg: Edema present.     Left lower leg: Edema present.     Comments:  Mild 1+ edema to bilatearl feet/ankles  Skin:    General: Skin is warm and dry.     Findings: No rash.  Neurological:     Mental Status: He is alert.        Assessment & Plan:   Edema of both lower legs He has bilateral lower leg edema with probable chronic venous insufficiency.  I am going to do an ECHO on him.  We discussed treatment and I want him to avoid salt, elevate his legs and he needs to use mid calf compression hose.    Return in about 3 months (around 12/08/2023).   Selinda Fleeta Finger, MD

## 2023-09-07 NOTE — Assessment & Plan Note (Signed)
 He has bilateral lower leg edema with probable chronic venous insufficiency.  I am going to do an ECHO on him.  We discussed treatment and I want him to avoid salt, elevate his legs and he needs to use mid calf compression hose.

## 2023-09-09 ENCOUNTER — Ambulatory Visit: Payer: BC Managed Care – PPO | Admitting: Nurse Practitioner

## 2023-09-28 ENCOUNTER — Ambulatory Visit: Attending: Internal Medicine

## 2023-09-28 DIAGNOSIS — R600 Localized edema: Secondary | ICD-10-CM | POA: Diagnosis not present

## 2023-09-28 LAB — ECHOCARDIOGRAM COMPLETE
AR max vel: 2.39 cm2
AV Area VTI: 2.54 cm2
AV Area mean vel: 2.37 cm2
AV Mean grad: 4 mmHg
AV Peak grad: 8 mmHg
Ao pk vel: 1.41 m/s
Area-P 1/2: 2.94 cm2
MV VTI: 1.42 cm2
S' Lateral: 3.4 cm

## 2023-10-04 ENCOUNTER — Ambulatory Visit: Payer: Self-pay

## 2023-10-04 NOTE — Progress Notes (Signed)
 Patient called.  Patient aware.  I have called and informed the patient  The patient's ECHO was normal.  He has leg swelling from chronic venous insufficiency.   SABRA  Pt aware.

## 2023-12-10 ENCOUNTER — Ambulatory Visit: Admitting: Internal Medicine

## 2023-12-10 ENCOUNTER — Encounter: Payer: Self-pay | Admitting: Internal Medicine

## 2023-12-10 VITALS — BP 162/90 | HR 68 | Temp 97.2°F | Resp 18 | Ht 74.0 in | Wt 258.0 lb

## 2023-12-10 DIAGNOSIS — R600 Localized edema: Secondary | ICD-10-CM

## 2023-12-10 DIAGNOSIS — I10 Essential (primary) hypertension: Secondary | ICD-10-CM

## 2023-12-10 MED ORDER — LISINOPRIL 20 MG PO TABS: 20.0000 mg | ORAL_TABLET | Freq: Every day | ORAL | 3 refills | Status: DC

## 2023-12-10 NOTE — Assessment & Plan Note (Signed)
 His BP is not controlled today.  He had an elevated BP on his last visit which was his initial visit with us .  I wonder whether his LE edema could be secondary to his amlodipine .  I am going to stop amlodipine  and start him on a medium dose of lisinopril 20mg  daily and continue on atenolol  50mg  daily.

## 2023-12-10 NOTE — Assessment & Plan Note (Signed)
 Plan as above.

## 2023-12-10 NOTE — Progress Notes (Signed)
 Office Visit  Subjective   Patient ID: Anthony Rios   DOB: 1975-08-24   Age: 48 y.o.   MRN: [id number]   Chief Complaint Chief Complaint  Patient presents with   Follow-up    3 Month follow up     History of Present Illness Anthony Rios is a 48 yo male who comes in today to establish care.  He was previously seen by Sigrid Bathe with last visit 5 months ago.  He comes in today with a new complaint where he has noticed over the last 2 prior months before that, that he had been having swelling of his bilateral LE's with left worse than right.  He states he gets swelling from his feet up to his mid ankle.  He goes to bed at night and the swelling is improved in the morning and the longer he stands and is on his feet, the worse his swelling becomes.  There is no rash or color changes of his legs.  He use to work in [employer] and would stand up and walking on concrete floors most of his life but he just started a new office job and he has noticed this swelling.  There has been no chest pain, palpitations, or SOB, and no orthopnea.   I felt on his last visit that he the swelling in his ankles where either from amlodipine  vs. Chronic venous insuffiency.  I did obtain an ECHO which was done on 09/28/2023 which showed a normal LV function with a LVEF of 60-65%.  There was no left ventricle regional wall motion abnormalities. There was mild left ventricular hypertrophy. Left ventricular diastolic parameters were normal.    The patient also returns for followup of his hypertension.  He was diagnosed with HTN about 10 years ago.  Since his last, he has not had any problems.  He does not check his BP at home.  He is currently on:  amlodipine  10mg  daily and atenolol  50mg  daily.  He is tolerating his medications well.  He denies any      Past Medical History Past Medical History:  Diagnosis Date   Arthritis    Essential hypertension    Gout    Hypertriglyceridemia      Allergies Allergies   Allergen Reactions   Ibuprofen      Medications  Current Outpatient Medications:    allopurinol  (ZYLOPRIM ) 100 MG tablet, Take 1 tablet (100 mg total) by mouth daily., Disp: 90 tablet, Rfl: 1   atenolol  (TENORMIN ) 50 MG tablet, Take 1 tablet (50 mg total) by mouth daily., Disp: 90 tablet, Rfl: 1   fenofibrate  (TRICOR ) 48 MG tablet, Take 1 tablet (48 mg total) by mouth daily., Disp: 90 tablet, Rfl: 1   indomethacin  (INDOCIN ) 50 MG capsule, Take 1 capsule (50 mg total) by mouth 3 (three) times daily as needed for moderate pain (pain score 4-6)., Disp: 90 capsule, Rfl: 3   Omega-3 Fatty Acids (FISH OIL) 300 MG CAPS, Take by mouth., Disp: , Rfl:    Review of Systems Review of Systems  Constitutional:  Negative for chills and fever.  Eyes:  Negative for blurred vision and double vision.  Respiratory:  Negative for cough and shortness of breath.   Cardiovascular:  Negative for chest pain, palpitations and leg swelling.  Gastrointestinal:  Negative for abdominal pain, constipation, diarrhea, heartburn, nausea and vomiting.  Musculoskeletal:  Negative for myalgias.  Skin:  Negative for itching and rash.  Neurological:  Negative for dizziness, weakness and  headaches.       Objective:    Vitals BP (!) 162/90   Pulse 68   Temp (!) 97.2 F (36.2 C) (Temporal)   Resp 18   Ht 6' 2 (1.88 m)   Wt 258 lb (117 kg)   SpO2 97%   BMI 33.13 kg/m    Physical Examination Physical Exam Constitutional:      Appearance: Normal appearance. He is not ill-appearing.  Cardiovascular:     Rate and Rhythm: Normal rate and regular rhythm.     Pulses: Normal pulses.     Heart sounds: No murmur heard.    No friction rub. No gallop.  Pulmonary:     Effort: Pulmonary effort is normal. No respiratory distress.     Breath sounds: No wheezing, rhonchi or rales.  Abdominal:     General: Abdomen is flat. Bowel sounds are normal. There is no distension.     Palpations: Abdomen is soft.     Tenderness:  There is no abdominal tenderness.  Musculoskeletal:     Right lower leg: No edema.     Left lower leg: No edema.  Skin:    General: Skin is warm and dry.     Findings: No rash.  Neurological:     Mental Status: He is alert.        Assessment & Plan:   Essential hypertension His BP is not controlled today.  He had an elevated BP on his last visit which was his initial visit with us .  I wonder whether his LE edema could be secondary to his amlodipine .  I am going to stop amlodipine  and start him on a medium dose of lisinopril 20mg  daily and continue on atenolol  50mg  daily.    Edema of both lower legs Plan as above.    Return in about 3 months (around 03/13/2024) for annual.   Selinda Fleeta Finger, MD

## 2023-12-17 ENCOUNTER — Ambulatory Visit: Admitting: Internal Medicine

## 2023-12-17 VITALS — BP 170/94 | HR 64

## 2023-12-17 DIAGNOSIS — I10 Essential (primary) hypertension: Secondary | ICD-10-CM

## 2023-12-17 NOTE — Progress Notes (Signed)
 Nurse Visit BP Check  8:26am Regular Cuff BP 176/106 HR 68  8:46am Large Cuff BP 170/94 HR 64  Dr. Fleeta Finger stated to increase the patient Lisinopril to 40mg . The patient reports he has 20mg  and will double his dosage until he runs out.

## 2023-12-24 ENCOUNTER — Other Ambulatory Visit: Payer: Self-pay | Admitting: Nurse Practitioner

## 2023-12-24 DIAGNOSIS — I10 Essential (primary) hypertension: Secondary | ICD-10-CM

## 2023-12-24 DIAGNOSIS — M1A9XX0 Chronic gout, unspecified, without tophus (tophi): Secondary | ICD-10-CM

## 2023-12-31 ENCOUNTER — Other Ambulatory Visit: Payer: Self-pay

## 2023-12-31 MED ORDER — HYDROCHLOROTHIAZIDE 12.5 MG PO CAPS: 12.5000 mg | ORAL_CAPSULE | Freq: Every day | ORAL | 2 refills | Status: AC

## 2023-12-31 MED ORDER — LISINOPRIL 40 MG PO TABS: 40.0000 mg | ORAL_TABLET | Freq: Every day | ORAL | 2 refills | Status: AC

## 2023-12-31 NOTE — Progress Notes (Signed)
 Hydrochlorothiazide  per Dr. Fleeta Finger

## 2023-12-31 NOTE — Progress Notes (Signed)
 Refilled Lisinopril 40mg  per Dr. Fleeta Finger to [facility] in [place]

## 2024-01-03 ENCOUNTER — Other Ambulatory Visit: Payer: Self-pay | Admitting: Internal Medicine

## 2024-01-03 DIAGNOSIS — M1A9XX0 Chronic gout, unspecified, without tophus (tophi): Secondary | ICD-10-CM

## 2024-01-03 MED ORDER — ALLOPURINOL 100 MG PO TABS: 100.0000 mg | ORAL_TABLET | Freq: Every day | ORAL | 1 refills | Status: AC

## 2024-01-24 NOTE — Progress Notes (Signed)
 Anthony Rios                                          MRN: [id number]   01/24/2024   The [facility] Quality Team Specialist reviewed this patient medical record for the purposes of chart review for care gap closure. The following were reviewed: chart review for care gap closure-controlling blood pressure.    [facility] Quality Team

## 2024-03-14 ENCOUNTER — Encounter: Payer: BC Managed Care – PPO | Admitting: Nurse Practitioner

## 2024-03-20 ENCOUNTER — Encounter: Admitting: Internal Medicine

## 2024-03-27 ENCOUNTER — Other Ambulatory Visit: Payer: Self-pay | Admitting: Nurse Practitioner

## 2024-03-27 DIAGNOSIS — E782 Mixed hyperlipidemia: Secondary | ICD-10-CM
